# Patient Record
Sex: Female | Born: 1955 | Race: White | Hispanic: No | Marital: Married | Smoking: Never smoker
Health system: Southern US, Community
[De-identification: ages and names within clinical notes are randomized; demographics above are authoritative.]

## PROBLEM LIST (undated history)

## (undated) DIAGNOSIS — I341 Nonrheumatic mitral (valve) prolapse: Secondary | ICD-10-CM

## (undated) DIAGNOSIS — M810 Age-related osteoporosis without current pathological fracture: Secondary | ICD-10-CM

## (undated) DIAGNOSIS — I671 Cerebral aneurysm, nonruptured: Secondary | ICD-10-CM

## (undated) DIAGNOSIS — E7800 Pure hypercholesterolemia, unspecified: Secondary | ICD-10-CM

## (undated) DIAGNOSIS — Z8679 Personal history of other diseases of the circulatory system: Secondary | ICD-10-CM

## (undated) HISTORY — DX: Nonrheumatic mitral (valve) prolapse: I34.1

## (undated) HISTORY — DX: Age-related osteoporosis without current pathological fracture: M81.0

## (undated) HISTORY — DX: Pure hypercholesterolemia, unspecified: E78.00

## (undated) HISTORY — DX: Personal history of other diseases of the circulatory system: Z86.79

## (undated) HISTORY — DX: Cerebral aneurysm, nonruptured: I67.1

---

## 1974-10-26 HISTORY — PX: APPENDECTOMY: SHX54

## 1990-08-04 DIAGNOSIS — O321XX0 Maternal care for breech presentation, not applicable or unspecified: Secondary | ICD-10-CM

## 2008-07-04 ENCOUNTER — Encounter (INDEPENDENT_AMBULATORY_CARE_PROVIDER_SITE_OTHER): Payer: Self-pay | Admitting: Cardiovascular Disease

## 2008-07-04 ENCOUNTER — Ambulatory Visit: Admission: RE | Admit: 2008-07-04 | Discharge: 2008-07-04 | Payer: Self-pay | Admitting: Cardiovascular Disease

## 2014-12-25 DIAGNOSIS — M810 Age-related osteoporosis without current pathological fracture: Secondary | ICD-10-CM

## 2014-12-25 HISTORY — DX: Age-related osteoporosis without current pathological fracture: M81.0

## 2014-12-27 ENCOUNTER — Ambulatory Visit (INDEPENDENT_AMBULATORY_CARE_PROVIDER_SITE_OTHER): Payer: BLUE CROSS/BLUE SHIELD | Admitting: Nurse Practitioner

## 2014-12-27 ENCOUNTER — Encounter: Payer: Self-pay | Admitting: Nurse Practitioner

## 2014-12-27 ENCOUNTER — Other Ambulatory Visit: Payer: Self-pay | Admitting: Nurse Practitioner

## 2014-12-27 VITALS — BP 146/84 | HR 72 | Ht 66.25 in | Wt 151.0 lb

## 2014-12-27 DIAGNOSIS — Z0000 Encounter for general adult medical examination without abnormal findings: Secondary | ICD-10-CM

## 2014-12-27 DIAGNOSIS — Z01419 Encounter for gynecological examination (general) (routine) without abnormal findings: Secondary | ICD-10-CM

## 2014-12-27 DIAGNOSIS — N952 Postmenopausal atrophic vaginitis: Secondary | ICD-10-CM

## 2014-12-27 DIAGNOSIS — Z1231 Encounter for screening mammogram for malignant neoplasm of breast: Secondary | ICD-10-CM

## 2014-12-27 LAB — POCT URINALYSIS DIPSTICK
Bilirubin, UA: NEGATIVE
Blood, UA: NEGATIVE
Glucose, UA: NEGATIVE
Ketones, UA: NEGATIVE
LEUKOCYTES UA: NEGATIVE
Nitrite, UA: NEGATIVE
PH UA: 7
PROTEIN UA: NEGATIVE
UROBILINOGEN UA: NEGATIVE

## 2014-12-27 NOTE — Patient Instructions (Addendum)

## 2014-12-27 NOTE — Progress Notes (Signed)
Patient ID: Mary Rush, female   DOB: 1955-11-05, 59 y.o.   MRN: [id number] 59 y.o. G36P3003 Married  Caucasian Fe here for [place] annual exam.  Menopausal at age 5.  No HRT.  Did well with vaso symptoms treating with all natural supplements.  She has had increase in vaginal dryness and was given Vagifem in the past.  She did not want to use secondary to concerns about cancer.  She is willing to try Vagifem if the extra virgin olive oil does not work.  She was living in [place] and working in [place].  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  Patient's last menstrual period was 07/26/2005 (approximate).          Sexually active: Yes.    The current method of family planning is post menopausal status.    Exercising: Yes.    walking and low impact aerobics Smoker:  no  Health Maintenance: Pap:  07/2012, No pap history of abnormal MMG:  08/2012, normal Colonoscopy:  2008, normal, repeat in 10 years BMD:  2006, normal TDaP:  2005 Labs:  HB:  PCP, has appt in 2 weeks  Urine:  Negative    reports that she has never smoked. She has never used smokeless tobacco. She reports that she drinks about 1.2 oz of alcohol per week. She reports that she does not use illicit drugs.  History reviewed. No pertinent past medical history.  Past Surgical History  Procedure Laterality Date  . Appendectomy  1976  . Cesarean section      No current outpatient prescriptions on file.   No current facility-administered medications for this visit.    Family History  Problem Relation Age of Onset  . Hyperlipidemia Mother   . Heart failure Mother   . Breast cancer Mother 19    BRCA -, mastectomy right breast  . Alcohol abuse Father   . Hypertension Sister   . Hyperlipidemia Sister   . Hyperlipidemia Brother   . Hypertension Brother   . Hyperlipidemia Maternal Uncle   . Hypertension Maternal Uncle   . Heart disease Maternal Uncle   . Stroke Paternal Grandmother   . Stroke Paternal  Grandfather   . Hypertension Brother   . Hyperlipidemia Brother     ROS:  Pertinent items are noted in HPI.  Otherwise, a comprehensive ROS was negative.  Exam:   BP 146/84 mmHg  Pulse 72  Ht 5' 6.25" (1.683 m)  Wt 151 lb (68.493 kg)  BMI 24.18 kg/m2  LMP 07/26/2005 (Approximate) Height: 5' 6.25" (168.3 cm) Ht Readings from Last 3 Encounters:  12/27/14 5' 6.25" (1.683 m)    General appearance: alert, cooperative and appears stated age Head: Normocephalic, without obvious abnormality, atraumatic Neck: no adenopathy, supple, symmetrical, trachea midline and thyroid normal to inspection and palpation Lungs: clear to auscultation bilaterally Breasts: normal appearance, no masses or tenderness, inversion of left nipple whch has been normal for her. Heart: regular rate and rhythm Abdomen: soft, non-tender; no masses,  no organomegaly Extremities: extremities normal, atraumatic, no cyanosis or edema Skin: Skin color, texture, turgor normal. No rashes or lesions Lymph nodes: Cervical, supraclavicular, and axillary nodes normal. No abnormal inguinal nodes palpated Neurologic: Grossly normal   Pelvic: External genitalia:  no lesions              Urethra:  normal appearing urethra with no masses, tenderness or lesions  Bartholin's and Skene's: normal                 Vagina: atrophic appearing vagina with normal color and discharge, no lesions              Cervix: anteverted              Pap taken: Yes.   Bimanual Exam:  Uterus:  normal size, contour, position, consistency, mobility, non-tender              Adnexa: no mass, fullness, tenderness               Rectovaginal: Confirms               Anus:  normal sphincter tone, no lesions  Chaperone present:  yes  A:  Well Woman with normal exam  Postmenopausal no HRT  Atrophic vaginitis - past treatment with Vagifem    P:   Reviewed health and wellness pertinent to exam  Pap smear taken today  Mammogram is due and  will schedule for her  Discussion about WHI study and use of Vaginal estrogen.  Will follow with pap  She would like to try natural method for treatment with EVOO or coconut oil first.  Counseled on breast self exam, mammography screening, use and side effects of HRT, adequate intake of calcium and vitamin D, diet and exercise return annually or prn  An After Visit Summary was printed and given to the patient.  Medical records from [place] were to be sent over 2 weeks ago - she will contact them again.  Would like to review BMD and pap's

## 2014-12-27 NOTE — Progress Notes (Signed)
Scheduled patient while in office for 3D mammogram at [facility]. Appointment scheduled for 3/24 at 8:30am. Patient is agreeable to date and time.

## 2014-12-31 NOTE — Progress Notes (Signed)
Encounter reviewed by Dr. Amado Andal Silva.  

## 2015-01-01 LAB — IPS PAP TEST WITH HPV

## 2015-01-10 ENCOUNTER — Other Ambulatory Visit: Payer: Self-pay | Admitting: Family Medicine

## 2015-01-10 DIAGNOSIS — E2839 Other primary ovarian failure: Secondary | ICD-10-CM

## 2015-01-14 ENCOUNTER — Ambulatory Visit
Admission: RE | Admit: 2015-01-14 | Discharge: 2015-01-14 | Disposition: A | Discharge status: Home / Self Care | Payer: PRIVATE HEALTH INSURANCE | Source: Ambulatory Visit | Attending: Family Medicine | Admitting: Family Medicine

## 2015-01-14 ENCOUNTER — Ambulatory Visit
Admission: RE | Admit: 2015-01-14 | Discharge: 2015-01-14 | Disposition: A | Discharge status: Home / Self Care | Payer: PRIVATE HEALTH INSURANCE | Source: Ambulatory Visit | Attending: Nurse Practitioner | Admitting: Nurse Practitioner

## 2015-01-14 DIAGNOSIS — Z1231 Encounter for screening mammogram for malignant neoplasm of breast: Secondary | ICD-10-CM

## 2015-01-14 DIAGNOSIS — E2839 Other primary ovarian failure: Secondary | ICD-10-CM

## 2015-01-17 ENCOUNTER — Ambulatory Visit: Payer: Self-pay

## 2015-02-11 ENCOUNTER — Encounter: Payer: Self-pay | Admitting: Certified Nurse Midwife

## 2015-12-30 ENCOUNTER — Encounter: Payer: Self-pay | Admitting: Nurse Practitioner

## 2015-12-30 ENCOUNTER — Ambulatory Visit (INDEPENDENT_AMBULATORY_CARE_PROVIDER_SITE_OTHER): Payer: BLUE CROSS/BLUE SHIELD | Admitting: Nurse Practitioner

## 2015-12-30 VITALS — BP 132/90 | HR 64 | Ht 66.0 in | Wt 151.0 lb

## 2015-12-30 DIAGNOSIS — N952 Postmenopausal atrophic vaginitis: Secondary | ICD-10-CM | POA: Diagnosis not present

## 2015-12-30 DIAGNOSIS — M810 Age-related osteoporosis without current pathological fracture: Secondary | ICD-10-CM | POA: Diagnosis not present

## 2015-12-30 DIAGNOSIS — Z0000 Encounter for general adult medical examination without abnormal findings: Secondary | ICD-10-CM

## 2015-12-30 DIAGNOSIS — E7800 Pure hypercholesterolemia, unspecified: Secondary | ICD-10-CM | POA: Diagnosis not present

## 2015-12-30 DIAGNOSIS — Z01419 Encounter for gynecological examination (general) (routine) without abnormal findings: Secondary | ICD-10-CM | POA: Diagnosis not present

## 2015-12-30 NOTE — Patient Instructions (Addendum)

## 2015-12-30 NOTE — Progress Notes (Signed)
Patient ID: Mary Rush, female   DOB: 08-Aug-1956, 60 y.o.   MRN: [id number] 60 y.o. G51P3003 Married  Caucasian Fe here for annual exam.  Monitors BP at home 120/80 with exercise and with no exercise 132/84.   Patient's last menstrual period was 07/26/2005 (approximate).          Sexually active: Yes.    The current method of family planning is post menopausal status.    Exercising: Yes.    Home exercise routine includes walking and light weights at least 3 times per week up to 6 times per week. Smoker:  no  Health Maintenance: Pap:12/27/14, Negative with neg HR HPV MMG:01/14/15, Bi-Rads 1: Negative  Colonoscopy: 2008, normal, repeat in 10 years BMD:01/14/15, T Score:  -2.7 Spine / -2.9 Left Femur Neck TDaP: 2005 PCP Shingles: PCP Pneumonia: Not indicated due to age Hep C and HIV: PCP Labs: PCP   reports that she has never smoked. She has never used smokeless tobacco. She reports that she drinks about 1.2 oz of alcohol per week. She reports that she does not use illicit drugs.  History reviewed. No pertinent past medical history.  Past Surgical History  Procedure Laterality Date  . Appendectomy  1976  . Cesarean section      Current Outpatient Prescriptions  Medication Sig Dispense Refill  . aspirin EC 81 MG tablet Take 81 mg by mouth 2 (two) times a week.    . Calcium Carbonate-Vit D-Min (CALCIUM 1200 PO) Take 1,200 mg by mouth daily.    . Flaxseed, Linseed, (FLAX SEED OIL) 1000 MG CAPS Take 1,000 mg by mouth 2 (two) times a week.     No current facility-administered medications for this visit.    Family History  Problem Relation Age of Onset  . Hyperlipidemia Mother   . Heart failure Mother   . Breast cancer Mother 51    BRCA -, mastectomy right breast  . Alcohol abuse Father   . Hypertension Sister   . Hyperlipidemia Sister   . Hyperlipidemia Brother   . Hypertension Brother   . Hyperlipidemia Maternal Uncle   . Hypertension Maternal Uncle   . Heart disease  Maternal Uncle   . Stroke Paternal Grandmother   . Stroke Paternal Grandfather   . Hypertension Brother   . Hyperlipidemia Brother     ROS:  Pertinent items are noted in HPI.  Otherwise, a comprehensive ROS was negative.  Exam:   BP 132/90 mmHg  Pulse 64  Ht 5' 6" (1.676 m)  Wt 151 lb (68.493 kg)  BMI 24.38 kg/m2  LMP 07/26/2005 (Approximate) Height: 5' 6" (167.6 cm) Ht Readings from Last 3 Encounters:  12/30/15 5' 6" (1.676 m)  12/27/14 5' 6.25" (1.683 m)    General appearance: alert, cooperative and appears stated age Head: Normocephalic, without obvious abnormality, atraumatic Neck: no adenopathy, supple, symmetrical, trachea midline and thyroid normal to inspection and palpation Lungs: clear to auscultation bilaterally Breasts: normal appearance, no masses or tenderness Heart: regular rate and rhythm Abdomen: soft, non-tender; no masses,  no organomegaly Extremities: extremities normal, atraumatic, no cyanosis or edema Skin: Skin color, texture, turgor normal. No rashes or lesions Lymph nodes: Cervical, supraclavicular, and axillary nodes normal. No abnormal inguinal nodes palpated Neurologic: Grossly normal   Pelvic: External genitalia:  no lesions              Urethra:  normal appearing urethra with no masses, tenderness or lesions  Bartholin's and Skene's: normal                 Vagina: normal appearing vagina with normal color and discharge, no lesions              Cervix: anteverted              Pap taken: No. Bimanual Exam:  Uterus:  normal size, contour, position, consistency, mobility, non-tender              Adnexa: no mass, fullness, tenderness               Rectovaginal: Confirms               Anus:  normal sphincter tone, no lesions  Chaperone present: no  A:  Well Woman with normal exam  Postmenopausal no HRT Atrophic vaginitis - past treatment with Vagifem  Osteoporosis  Hypercholesterolemia  White coat  hypertension  History of MVP   P:   Reviewed health and wellness pertinent to exam  Pap smear as above  Mammogram is due 12/2015  She is changing to a new PCP - we will make a referral; and will discuss osteoporosis  She plans on update of immunizations at PCP  Counseled on breast self exam, mammography screening, adequate intake of calcium and vitamin D, diet and exercise return annually or prn  An After Visit Summary was printed and given to the patient.

## 2016-01-03 NOTE — Progress Notes (Signed)
Encounter reviewed by Dr. Brook Amundson C. Silva.  

## 2016-01-14 ENCOUNTER — Other Ambulatory Visit: Payer: Self-pay

## 2016-01-14 DIAGNOSIS — Z1231 Encounter for screening mammogram for malignant neoplasm of breast: Secondary | ICD-10-CM

## 2016-01-28 ENCOUNTER — Ambulatory Visit
Admission: RE | Admit: 2016-01-28 | Discharge: 2016-01-28 | Disposition: A | Discharge status: Home / Self Care | Payer: BLUE CROSS/BLUE SHIELD | Source: Ambulatory Visit

## 2016-01-28 DIAGNOSIS — Z1231 Encounter for screening mammogram for malignant neoplasm of breast: Secondary | ICD-10-CM

## 2016-06-30 ENCOUNTER — Other Ambulatory Visit: Payer: Self-pay | Admitting: Internal Medicine

## 2016-06-30 DIAGNOSIS — R0609 Other forms of dyspnea: Principal | ICD-10-CM

## 2016-07-10 ENCOUNTER — Other Ambulatory Visit (HOSPITAL_COMMUNITY): Payer: BLUE CROSS/BLUE SHIELD

## 2016-08-03 ENCOUNTER — Other Ambulatory Visit: Payer: Self-pay | Admitting: Cardiology

## 2016-08-03 DIAGNOSIS — Z823 Family history of stroke: Secondary | ICD-10-CM

## 2016-08-15 ENCOUNTER — Ambulatory Visit
Admission: RE | Admit: 2016-08-15 | Discharge: 2016-08-15 | Disposition: A | Discharge status: Home / Self Care | Payer: BLUE CROSS/BLUE SHIELD | Source: Ambulatory Visit | Attending: Cardiology | Admitting: Cardiology

## 2016-08-15 DIAGNOSIS — Z823 Family history of stroke: Secondary | ICD-10-CM

## 2016-08-20 ENCOUNTER — Emergency Department (HOSPITAL_COMMUNITY)
Admission: EM | Admit: 2016-08-20 | Discharge: 2016-08-20 | Disposition: A | Discharge status: Home / Self Care | Payer: BLUE CROSS/BLUE SHIELD | Attending: Emergency Medicine | Admitting: Emergency Medicine

## 2016-08-20 ENCOUNTER — Emergency Department (HOSPITAL_COMMUNITY): Payer: BLUE CROSS/BLUE SHIELD

## 2016-08-20 ENCOUNTER — Encounter (HOSPITAL_COMMUNITY): Payer: Self-pay | Admitting: Emergency Medicine

## 2016-08-20 DIAGNOSIS — R55 Syncope and collapse: Secondary | ICD-10-CM | POA: Diagnosis not present

## 2016-08-20 DIAGNOSIS — Y92009 Unspecified place in unspecified non-institutional (private) residence as the place of occurrence of the external cause: Secondary | ICD-10-CM

## 2016-08-20 DIAGNOSIS — W19XXXA Unspecified fall, initial encounter: Secondary | ICD-10-CM

## 2016-08-20 DIAGNOSIS — Y999 Unspecified external cause status: Secondary | ICD-10-CM | POA: Insufficient documentation

## 2016-08-20 DIAGNOSIS — Y9289 Other specified places as the place of occurrence of the external cause: Secondary | ICD-10-CM | POA: Diagnosis not present

## 2016-08-20 DIAGNOSIS — Z7982 Long term (current) use of aspirin: Secondary | ICD-10-CM | POA: Diagnosis not present

## 2016-08-20 DIAGNOSIS — Y9301 Activity, walking, marching and hiking: Secondary | ICD-10-CM | POA: Diagnosis not present

## 2016-08-20 DIAGNOSIS — W0110XA Fall on same level from slipping, tripping and stumbling with subsequent striking against unspecified object, initial encounter: Secondary | ICD-10-CM | POA: Insufficient documentation

## 2016-08-20 DIAGNOSIS — M79651 Pain in right thigh: Secondary | ICD-10-CM | POA: Diagnosis present

## 2016-08-20 LAB — CBC
HCT: 38.7 % (ref 36.0–46.0)
Hemoglobin: 13.2 g/dL (ref 12.0–15.0)
MCH: 31.8 pg (ref 26.0–34.0)
MCHC: 34.1 g/dL (ref 30.0–36.0)
MCV: 93.3 fL (ref 78.0–100.0)
PLATELETS: 252 10*3/uL (ref 150–400)
RBC: 4.15 MIL/uL (ref 3.87–5.11)
RDW: 12.8 % (ref 11.5–15.5)
WBC: 11.3 10*3/uL — AB (ref 4.0–10.5)

## 2016-08-20 LAB — BASIC METABOLIC PANEL
Anion gap: 5 (ref 5–15)
BUN: 11 mg/dL (ref 6–20)
CALCIUM: 9.1 mg/dL (ref 8.9–10.3)
CO2: 24 mmol/L (ref 22–32)
CREATININE: 0.85 mg/dL (ref 0.44–1.00)
Chloride: 105 mmol/L (ref 101–111)
Glucose, Bld: 113 mg/dL — ABNORMAL HIGH (ref 65–99)
Potassium: 4.5 mmol/L (ref 3.5–5.1)
SODIUM: 134 mmol/L — AB (ref 135–145)

## 2016-08-20 LAB — CBG MONITORING, ED: Glucose-Capillary: 101 mg/dL — ABNORMAL HIGH (ref 65–99)

## 2016-08-20 NOTE — ED Triage Notes (Signed)
Mechanical fall; tripped over a rug corner; fell against a toy car hitting right thigh; pain in right thigh was so intense it made her pass out; when came to after a few minutes felt diaphoretic and nauseated. Did not hit head. Pain in leg dissipated, still feels nauseated.

## 2016-08-20 NOTE — ED Provider Notes (Signed)
[facility] DEPT Provider Note   CSN: [id number] Arrival date & time: 08/20/16  1122     History   Chief Complaint Chief Complaint  Patient presents with  . Fall  . Loss of Consciousness    HPI Mary Rush is a 60 y.o. female.  HPI   60 year old female with history of osteoporosis and history of mitral valve prolapse presenting for evaluation of a recent fall. Patient report 2 hours ago she was babysitting her grandchild. While holding her grandchild and walking she accidentally tripped over the carpet and fell to her right side. Patient states she was trying to protect her grandchild from striking the floor and therefore her entire thigh took the brunt of the impact that she hits a toy car on the floor. She report acute onset of sharp throbbing pain to her right thigh follows with feeling lightheadedness from the intense pain. Patient states she sat up on the couch, with her head between her knee and subsequently had a brief non-witnessed syncopal episode. Patient came to, she was able to ambulate and decided to seek medical attention for her thigh pain. Patient states she has history of osteoporosis and concern for potential broken bone or vascular injury. She did not experience any pops or cracks from the fall. At this time her pain has subsided and patient felt much better. She denies having any active headache, neck pain, chest pain, trouble breathing, abdominal pain, back pain, hip, knee, or ankle pain. Her primary  pain is to the right lateral thigh. No specific treatment tried. She denies any precipitating symptoms prior to the fall. Currently denies having any numbness.  Past Medical History:  Diagnosis Date  . MVP (mitral valve prolapse)    past cardio eval yearly with echo.  . Osteoporosis 12/2014    There are no active problems to display for this patient.   Past Surgical History:  Procedure Laterality Date  . APPENDECTOMY  1976  . CESAREAN SECTION      OB  History    Gravida Para Term Preterm AB Living   _0 0 0 3   SAB TAB Ectopic Multiple Live Births   0 0 0 0 3       Home Medications    Prior to Admission medications   Medication Sig Start Date End Date Taking? Authorizing Provider  aspirin EC 81 MG tablet Take 81 mg by mouth 2 (two) times a week.    Historical Provider, MD  Calcium Carbonate-Vit D-Min (CALCIUM 1200 PO) Take 1,200 mg by mouth daily.    Historical Provider, MD  Flaxseed, Linseed, (FLAX SEED OIL) 1000 MG CAPS Take 1,000 mg by mouth 2 (two) times a week.    Historical Provider, MD    Family History Family History  Problem Relation Age of Onset  . Hyperlipidemia Mother   . Heart failure Mother   . Breast cancer Mother 61    BRCA -, mastectomy right breast  . Alcohol abuse Father   . Hypertension Sister   . Hyperlipidemia Sister   . Hyperlipidemia Brother   . Hypertension Brother   . Hyperlipidemia Maternal Uncle   . Hypertension Maternal Uncle   . Heart disease Maternal Uncle   . Stroke Paternal Grandmother   . Stroke Paternal Grandfather   . Hypertension Brother   . Hyperlipidemia Brother     Social History Social History  Substance Use Topics  . Smoking status: Never Smoker  . Smokeless tobacco: Never Used  .  Alcohol use 1.2 oz/week    2 Standard drinks or equivalent per week     Allergies   Review of patient's allergies indicates no known allergies.   Review of Systems Review of Systems  All other systems reviewed and are negative.    Physical Exam Updated Vital Signs BP 140/90 (BP Location: Right Arm)   Pulse (!) 57   Temp 98 F (36.7 C) (Oral)   Resp 18   Ht _0  (1.702 m)   Wt 68 kg   LMP 07/26/2005 (Approximate)   SpO2 99%   BMI 23.49 kg/m   Physical Exam  Constitutional: She is oriented to person, place, and time. She appears well-developed and well-nourished. No distress.  HENT:  Head: Atraumatic.  Eyes: Conjunctivae are normal.  Neck: Neck supple.    Cardiovascular: Normal rate and regular rhythm.   Pulmonary/Chest: Effort normal and breath sounds normal.  Abdominal: Soft. There is no tenderness.  Musculoskeletal: She exhibits tenderness (Right thigh: Point tenderness to lateral distal thigh with a faint erythema without any crepitus or deformity. No ecchymosis noted.).  Right hip and pelvis as well as right knee and ankle are nontender with full range of motion.  Neurological: She is alert and oriented to person, place, and time. She has normal strength. No cranial nerve deficit or sensory deficit. GCS eye subscore is 4. GCS verbal subscore is 5. GCS motor subscore is 6.  Skin: No rash noted.  Psychiatric: She has a normal mood and affect.  Nursing note and vitals reviewed.    ED Treatments / Results  Labs (all labs ordered are listed, but only abnormal results are displayed) Labs Reviewed  BASIC METABOLIC PANEL - Abnormal; Notable for the following:       Result Value   Sodium 134 (*)    Glucose, Bld 113 (*)    All other components within normal limits  CBC - Abnormal; Notable for the following:    WBC 11.3 (*)    All other components within normal limits  CBG MONITORING, ED - Abnormal; Notable for the following:    Glucose-Capillary 101 (*)    All other components within normal limits  URINALYSIS, ROUTINE W REFLEX MICROSCOPIC (NOT AT [facility])    EKG  EKG Interpretation None       Radiology Dg Femur Min 2 Views Right  Result Date: 08/20/2016 CLINICAL DATA:  Injury.  Fall. EXAM: RIGHT FEMUR 2 VIEWS COMPARISON:  No recent prior. FINDINGS: Degenerative changes right hip. No acute bony or joint abnormality. No evidence of fracture dislocation. Tiny sclerotic focus noted over the right acetabulum most likely tiny bone island . IMPRESSION: Degenerative changes right hip.  No acute bony or joint abnormality. Electronically Signed   By: Marcello Moores  Register   On: 08/20/2016 13:33    Procedures Procedures (including critical care  time)  Medications Ordered in ED Medications - No data to display   Initial Impression / Assessment and Plan / ED Course  I have reviewed the triage vital signs and the nursing notes.  Pertinent labs & imaging results that were available during my care of the patient were reviewed by me and considered in my medical decision making (see chart for details).  Clinical Course    BP 131/94   Pulse 70   Temp 98 F (36.7 C) (Oral)   Resp 12   Ht _1  (1.702 m)   Wt 68 kg   LMP 07/26/2005 (Approximate)   SpO2 100%   BMI  23.49 kg/m    Final Clinical Impressions(s) / ED Diagnoses   Final diagnoses:  Fall in home, initial encounter    New Prescriptions New Prescriptions   No medications on file   1:12 PM Patient had a mechanical fall injuring her right thigh. She was able to ambulate afterward, I have low suspicion for any acute fractures or dislocation. Her syncopal episode is likely secondary to pain from the fall. No other precipitating symptoms. She is at baseline and has no focal neuro deficit on exam.  4:18 PM Xray of R femur without acute finding.  Pt ambulate without difficulty.  Normal orthostatic vital sign.  Pt felt comfortable going home.  Return precaution discussed.   Domenic Moras, PA-C 08/20/16 1618    Gareth Morgan, MD 08/20/16 2342

## 2016-12-01 DIAGNOSIS — I773 Arterial fibromuscular dysplasia: Secondary | ICD-10-CM | POA: Insufficient documentation

## 2016-12-01 DIAGNOSIS — I671 Cerebral aneurysm, nonruptured: Secondary | ICD-10-CM | POA: Insufficient documentation

## 2016-12-29 NOTE — Progress Notes (Signed)
Patient ID: Mary Rush, female   DOB: April 26, 1956, 61 y.o.   MRN: [id number]  61 y.o. G3P3003 Married  Caucasian Fe here for annual exam.  Evaluated at [facility] 11/05/2016 for a cerebral hemorrhage.  She had CT angiogram done and reveals no hemorrhage but vascular changes that could be FMD  (fibromuscular dysplasia).   Not on any BP med's.  No vaso symptoms.   Patient's last menstrual period was 07/26/2005 (approximate).          Sexually active: Yes.    The current method of family planning is post menopausal status.    Exercising: Yes.    Home exercise routine includes walking 30 minutes, weights and elliptical at least 15 minutes 5 days per week. Smoker:  no  Health Maintenance: Pap: 12/27/14, Negative with neg HR HPV  07/2012, Negative MMG:01/28/16, Bi-Rads 1: Negative  Colonoscopy: 2008, normal, repeat in 10 years BMD:01/14/15, T Score:  -2.7 Spine / -2.9 Left Femur Neck TDaP:2017 PCP Shingles: declines  Pneumonia: Not indicated due to age Hep C and HIV: PCP Labs: PCP   reports that she has never smoked. She has never used smokeless tobacco. She reports that she drinks about 1.2 oz of alcohol per week . She reports that she does not use drugs.  Past Medical History:  Diagnosis Date  . MVP (mitral valve prolapse)    past cardio eval yearly with echo.  . Osteoporosis 12/2014    Past Surgical History:  Procedure Laterality Date  . APPENDECTOMY  1976  . CESAREAN SECTION      Current Outpatient Prescriptions  Medication Sig Dispense Refill  . alendronate (FOSAMAX) 70 MG tablet Take 70 mg by mouth once a week. Take with a full glass of water on an empty stomach.    Marland Kitchen aspirin EC 81 MG tablet Take 81 mg by mouth 2 (two) times a week.    . Calcium Carbonate-Vit D-Min (CALCIUM 1200 PO) Take 1,200 mg by mouth daily.    . cholecalciferol (VITAMIN D) 1000 units tablet Take 1 tablet by mouth 3 (three) times a week.     No current facility-administered medications for this visit.      Family History  Problem Relation Age of Onset  . Hyperlipidemia Mother   . Heart failure Mother   . Breast cancer Mother 54    BRCA -, mastectomy right breast  . Alcohol abuse Father   . Hypertension Sister   . Hyperlipidemia Sister   . Hyperlipidemia Brother   . Hypertension Brother   . Hyperlipidemia Maternal Uncle   . Hypertension Maternal Uncle   . Heart disease Maternal Uncle   . Stroke Paternal Grandmother   . Stroke Paternal Grandfather   . Hypertension Brother   . Hyperlipidemia Brother     ROS:  Pertinent items are noted in HPI.  Otherwise, a comprehensive ROS was negative.  Exam:   BP 128/84 (BP Location: Right Arm, Patient Position: Sitting, Cuff Size: Normal)   Pulse 64   Ht 5' 6" (1.676 m)   Wt 153 lb (69.4 kg)   LMP 07/26/2005 (Approximate)   BMI 24.69 kg/m  Height: 5' 6" (167.6 cm) Ht Readings from Last 3 Encounters:  12/30/16 5' 6" (1.676 m)  08/20/16 5' 7" (1.702 m)  12/30/15 5' 6" (1.676 m)    General appearance: alert, cooperative and appears stated age Head: Normocephalic, without obvious abnormality, atraumatic Neck: no adenopathy, supple, symmetrical, trachea midline and thyroid normal to inspection and palpation Lungs: clear to  auscultation bilaterally Breasts: normal appearance, no masses or tenderness Heart: regular rate and rhythm Abdomen: soft, non-tender; no masses,  no organomegaly Extremities: extremities normal, atraumatic, no cyanosis or edema Skin: Skin color, texture, turgor normal. No rashes or lesions Lymph nodes: Cervical, supraclavicular, and axillary nodes normal. No abnormal inguinal nodes palpated Neurologic: Grossly normal   Pelvic: External genitalia:  no lesions              Urethra:  normal appearing urethra with no masses, tenderness or lesions              Bartholin's and Skene's: normal                 Vagina: normal appearing vagina with normal color and discharge, no lesions              Cervix: anteverted               Pap taken: No. Bimanual Exam:  Uterus:  normal size, contour, position, consistency, mobility, non-tender              Adnexa: no mass, fullness, tenderness               Rectovaginal: Confirms               Anus:  normal sphincter tone, no lesions  Chaperone present: yes  A:  Well Woman with normal exam  Postmenopausal no HRT Atrophic vaginitis - past treatment with Vagifem - do not use now!             Osteoporosis - managed by PCP             Hypercholesterolemia             White coat hypertension             History of MVP  New diagnosis of Fibromuscular dysplasia of the brain   P:   Reviewed health and wellness pertinent to exam  Pap smear not done  Mammogram is due 01/2017  Counseled on breast self exam, mammography screening, adequate intake of calcium and vitamin D, diet and exercise, Kegel's exercises return annually or prn  An After Visit Summary was printed and given to the patient.

## 2016-12-30 ENCOUNTER — Ambulatory Visit: Payer: 59 | Admitting: Nurse Practitioner

## 2016-12-30 ENCOUNTER — Encounter: Payer: Self-pay | Admitting: Nurse Practitioner

## 2016-12-30 VITALS — BP 128/84 | HR 64 | Ht 66.0 in | Wt 153.0 lb

## 2016-12-30 DIAGNOSIS — Z0000 Encounter for general adult medical examination without abnormal findings: Secondary | ICD-10-CM

## 2016-12-30 DIAGNOSIS — N952 Postmenopausal atrophic vaginitis: Secondary | ICD-10-CM

## 2016-12-30 DIAGNOSIS — Z01411 Encounter for gynecological examination (general) (routine) with abnormal findings: Secondary | ICD-10-CM | POA: Diagnosis not present

## 2016-12-30 DIAGNOSIS — Z8679 Personal history of other diseases of the circulatory system: Secondary | ICD-10-CM

## 2016-12-30 NOTE — Patient Instructions (Signed)

## 2017-01-04 NOTE — Progress Notes (Signed)
Encounter reviewed by Dr. Brook Amundson C. Silva.  

## 2017-02-01 ENCOUNTER — Other Ambulatory Visit: Payer: Self-pay | Admitting: Nurse Practitioner

## 2017-02-01 DIAGNOSIS — Z1231 Encounter for screening mammogram for malignant neoplasm of breast: Secondary | ICD-10-CM

## 2017-02-03 ENCOUNTER — Other Ambulatory Visit: Payer: Self-pay | Admitting: Internal Medicine

## 2017-02-03 DIAGNOSIS — M810 Age-related osteoporosis without current pathological fracture: Secondary | ICD-10-CM

## 2017-02-09 DIAGNOSIS — I10 Essential (primary) hypertension: Secondary | ICD-10-CM | POA: Insufficient documentation

## 2017-02-09 DIAGNOSIS — M810 Age-related osteoporosis without current pathological fracture: Secondary | ICD-10-CM | POA: Insufficient documentation

## 2017-02-16 ENCOUNTER — Ambulatory Visit: Payer: BLUE CROSS/BLUE SHIELD

## 2017-02-26 ENCOUNTER — Ambulatory Visit
Admission: RE | Admit: 2017-02-26 | Discharge: 2017-02-26 | Disposition: A | Discharge status: Home / Self Care | Payer: PRIVATE HEALTH INSURANCE | Source: Ambulatory Visit | Attending: Internal Medicine | Admitting: Internal Medicine

## 2017-02-26 ENCOUNTER — Ambulatory Visit
Admission: RE | Admit: 2017-02-26 | Discharge: 2017-02-26 | Disposition: A | Discharge status: Home / Self Care | Payer: PRIVATE HEALTH INSURANCE | Source: Ambulatory Visit | Attending: Nurse Practitioner | Admitting: Nurse Practitioner

## 2017-02-26 DIAGNOSIS — M810 Age-related osteoporosis without current pathological fracture: Secondary | ICD-10-CM

## 2017-02-26 DIAGNOSIS — Z1231 Encounter for screening mammogram for malignant neoplasm of breast: Secondary | ICD-10-CM

## 2017-05-24 ENCOUNTER — Telehealth: Payer: Self-pay | Admitting: Obstetrics & Gynecology

## 2017-05-24 NOTE — Telephone Encounter (Signed)
Left patient a message to call back to reschedule a future appointment that was cancelled by the provider. °

## 2018-01-03 ENCOUNTER — Ambulatory Visit: Payer: 59 | Admitting: Nurse Practitioner

## 2018-01-05 ENCOUNTER — Encounter: Payer: Self-pay | Admitting: Certified Nurse Midwife

## 2018-01-05 ENCOUNTER — Other Ambulatory Visit (HOSPITAL_COMMUNITY)
Admission: RE | Admit: 2018-01-05 | Discharge: 2018-01-05 | Disposition: A | Discharge status: Home / Self Care | Payer: PRIVATE HEALTH INSURANCE | Source: Ambulatory Visit | Attending: Certified Nurse Midwife | Admitting: Certified Nurse Midwife

## 2018-01-05 ENCOUNTER — Other Ambulatory Visit: Payer: Self-pay

## 2018-01-05 ENCOUNTER — Ambulatory Visit (INDEPENDENT_AMBULATORY_CARE_PROVIDER_SITE_OTHER): Payer: No Typology Code available for payment source | Admitting: Certified Nurse Midwife

## 2018-01-05 VITALS — BP 110/68 | HR 68 | Resp 16 | Ht 66.25 in | Wt 159.0 lb

## 2018-01-05 DIAGNOSIS — Z780 Asymptomatic menopausal state: Secondary | ICD-10-CM | POA: Diagnosis not present

## 2018-01-05 DIAGNOSIS — Z01419 Encounter for gynecological examination (general) (routine) without abnormal findings: Secondary | ICD-10-CM

## 2018-01-05 DIAGNOSIS — Z124 Encounter for screening for malignant neoplasm of cervix: Secondary | ICD-10-CM

## 2018-01-05 NOTE — Progress Notes (Signed)
62 y.o. G88P3003 Married  Caucasian Fe here for annual exam. Menopausal  No HRT. Denies vaginal bleeding. Some vaginal dryness and using coconut oil prn. Sees Dr. Virgina Jock yearly for cholesterol/Fosamax management and labs. 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 Staying busy since retirement. No other health concerns today. Considering Cologard instead of colonoscopy at this point. Planning vacation at some point!   Patient's last menstrual period was 07/26/2005 (approximate).          Sexually active: No.  The current method of family planning is post menopausal status.    Exercising: Yes.    walking Smoker:  no  Health Maintenance: Pap:  12-27-14 neg HPV HR neg History of Abnormal Pap: no MMG:  02-26-17 category c density birads 1:neg Self Breast exams: yes Colonoscopy:  2008 f/u 38yr BMD:   2018 TDaP:  2017 Shingles: no, declines Pneumonia: no, declines Hep C and HIV: not done Labs: pcp   reports that  has never smoked. she has never used smokeless tobacco. She reports that she drinks about 0.6 - 1.2 oz of alcohol per week. She reports that she does not use drugs.  Past Medical History:  Diagnosis Date  . MVP (mitral valve prolapse)    per patient physician states she does not have this  . Osteoporosis 12/2014    Past Surgical History:  Procedure Laterality Date  . APPENDECTOMY  1976  . CESAREAN SECTION      Current Outpatient Medications  Medication Sig Dispense Refill  . alendronate (FOSAMAX) 70 MG tablet Take 70 mg by mouth once a week. Take with a full glass of water on an empty stomach.    . AMLODIPINE BESYLATE PO 5 mg daily.    .Marland Kitchenaspirin EC 81 MG tablet Take 81 mg by mouth 2 (two) times a week.    .Marland KitchenCALCIUM PO Take by mouth.    . rosuvastatin (CRESTOR) 5 MG tablet Take 5 mg by mouth daily.     No current facility-administered medications for this visit.     Family History  Problem Relation Age of  Onset  . Hyperlipidemia Mother   . Heart failure Mother   . Breast cancer Mother 815      BRCA -, mastectomy right breast  . Alcohol abuse Father   . Hypertension Sister   . Hyperlipidemia Sister   . Hyperlipidemia Brother   . Hypertension Brother   . Hyperlipidemia Maternal Uncle   . Hypertension Maternal Uncle   . Heart disease Maternal Uncle   . Stroke Paternal Grandmother   . Stroke Paternal Grandfather   . Hypertension Brother   . Hyperlipidemia Brother     ROS:  Pertinent items are noted in HPI.  Otherwise, a comprehensive ROS was negative.  Exam:   BP 110/68   Pulse 68   Resp 16   Ht 5' 6.25" (1.683 m)   Wt 159 lb (72.1 kg)   LMP 07/26/2005 (Approximate)   BMI 25.47 kg/m  Height: 5' 6.25" (168.3 cm) Ht Readings from Last 3 Encounters:  01/05/18 5' 6.25" (1.683 m)  12/30/16 '5\' 6"'  (1.676 m)  08/20/16 '5\' 7"'  (1.702 m)    General appearance: alert, cooperative and appears stated age Head: Normocephalic, without obvious abnormality, atraumatic Neck: no adenopathy, supple, symmetrical, trachea midline and thyroid normal to inspection and palpation Lungs: clear to auscultation bilaterally Breasts: normal appearance, no masses or tenderness, No nipple retraction or dimpling,  No nipple discharge or bleeding, No axillary or supraclavicular adenopathy, inverted nipples easily seen Heart: regular rate and rhythm Abdomen: soft, non-tender; no masses,  no organomegaly Extremities: extremities normal, atraumatic, no cyanosis or edema Skin: Skin color, texture, turgor normal. No rashes or lesions Lymph nodes: Cervical, supraclavicular, and axillary nodes normal. No abnormal inguinal nodes palpated Neurologic: Grossly normal   Pelvic: External genitalia:  no lesions              Urethra:  normal appearing urethra with no masses, tenderness or lesions              Bartholin's and Skene's: normal                 Vagina: normal appearing vagina with normal color and discharge,  no lesions              Cervix: no cervical motion tenderness, no lesions and nulliparous appearance              Pap taken: Yes.   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 Patient prefers to continue coconut oil use instructions given. Questions addressed.  Continue follow with MD's as indicated.  Will do Cologard with Dr. Virgina Jock.  Pap smear: yes   counseled on breast self exam, mammography screening, menopause, adequate intake of calcium and vitamin D, diet and exercise, Kegel's exercises  return annually or prn  An After Visit Summary was printed and given to the patient.

## 2018-01-05 NOTE — Patient Instructions (Signed)

## 2018-01-06 LAB — CYTOLOGY - PAP
DIAGNOSIS: NEGATIVE
HPV: NOT DETECTED

## 2018-03-25 ENCOUNTER — Other Ambulatory Visit: Payer: Self-pay | Admitting: Internal Medicine

## 2018-03-25 DIAGNOSIS — Z1231 Encounter for screening mammogram for malignant neoplasm of breast: Secondary | ICD-10-CM

## 2018-03-25 DIAGNOSIS — E785 Hyperlipidemia, unspecified: Secondary | ICD-10-CM | POA: Insufficient documentation

## 2018-03-30 ENCOUNTER — Ambulatory Visit (INDEPENDENT_AMBULATORY_CARE_PROVIDER_SITE_OTHER): Payer: PRIVATE HEALTH INSURANCE

## 2018-03-30 DIAGNOSIS — Z1231 Encounter for screening mammogram for malignant neoplasm of breast: Secondary | ICD-10-CM

## 2019-01-25 ENCOUNTER — Ambulatory Visit: Payer: No Typology Code available for payment source | Admitting: Certified Nurse Midwife

## 2019-04-17 ENCOUNTER — Ambulatory Visit: Payer: No Typology Code available for payment source | Admitting: Certified Nurse Midwife

## 2019-05-01 NOTE — Progress Notes (Signed)
63 y.o. G28P3003 Married  Caucasian Fe here for annual exam. Post menopausal denies vaginal bleeding or some dryness, not sexually active due spouse health. Sees PCP Dr. Virgina Jock for cholesterol and osteopenia management. Walking for exercise and weight loss down 11 pounds. Has retired and trying to find things to do! Family marriage recently. No other health issues today.  Patient's last menstrual period was 07/26/2005 (approximate).          Sexually active: No.  The current method of family planning is post menopausal status.    Exercising: Yes.    walking Smoker:  no  Review of Systems  Constitutional: Negative.   HENT: Negative.   Eyes: Negative.   Respiratory: Negative.   Cardiovascular: Negative.   Gastrointestinal: Negative.   Genitourinary: Negative.   Musculoskeletal: Negative.   Skin:       Vaginal atrophy  Neurological: Negative.   Endo/Heme/Allergies: Negative.   Psychiatric/Behavioral: Negative.     Health Maintenance: Pap:  01-05-18 neg HPV HR neg presence of endos cant be determined due to atrophy. History of Abnormal Pap: no MMG:  03-30-18 category c density birads 1:neg Self Breast exams: yes Colonoscopy:  2008, cologard 52yr ago BMD:   2018 TDaP:  2017  Shingles: declines Pneumonia: declines Hep C and HIV: not done Labs: PCP   reports that she has never smoked. She has never used smokeless tobacco. She reports current alcohol use of about 1.0 - 2.0 standard drinks of alcohol per week. She reports that she does not use drugs.  Past Medical History:  Diagnosis Date  . MVP (mitral valve prolapse)    per patient physician states she does not have this  . Osteoporosis 12/2014    Past Surgical History:  Procedure Laterality Date  . APPENDECTOMY  1976  . CESAREAN SECTION      Current Outpatient Medications  Medication Sig Dispense Refill  . alendronate (FOSAMAX) 70 MG tablet Take 70 mg by mouth once a week. Take with a full glass of water on an empty stomach.     . AMLODIPINE BESYLATE PO 5 mg daily.    .Marland Kitchenaspirin EC 81 MG tablet Take 81 mg by mouth 2 (two) times a week.    .Marland KitchenCALCIUM PO Take by mouth.    . rosuvastatin (CRESTOR) 5 MG tablet Take 5 mg by mouth daily.     No current facility-administered medications for this visit.     Family History  Problem Relation Age of Onset  . Hyperlipidemia Mother   . Heart failure Mother   . Breast cancer Mother 874      BRCA -, mastectomy right breast  . Alcohol abuse Father   . Hypertension Sister   . Hyperlipidemia Sister   . Hyperlipidemia Brother   . Hypertension Brother   . Hyperlipidemia Maternal Uncle   . Hypertension Maternal Uncle   . Heart disease Maternal Uncle   . Stroke Paternal Grandmother   . Stroke Paternal Grandfather   . Hypertension Brother   . Hyperlipidemia Brother     ROS:  Pertinent items are noted in HPI.  Otherwise, a comprehensive ROS was negative.  Exam:   LMP 07/26/2005 (Approximate)    Ht Readings from Last 3 Encounters:  01/05/18 5' 6.25" (1.683 m)  12/30/16 _0  (1.676 m)  08/20/16 _1  (1.702 m)    General appearance: alert, cooperative and appears stated age Head: Normocephalic, without obvious abnormality, atraumatic Neck: no adenopathy, supple, symmetrical, trachea midline and thyroid  normal to inspection and palpation Lungs: clear to auscultation bilaterally Breasts: normal appearance, no masses or tenderness, No nipple retraction or dimpling, No nipple discharge or bleeding, No axillary or supraclavicular adenopathy Heart: regular rate and rhythm Abdomen: soft, non-tender; no masses,  no organomegaly Extremities: extremities normal, atraumatic, no cyanosis or edema Skin: Skin color, texture, turgor normal. No rashes or lesions Lymph nodes: Cervical, supraclavicular, and axillary nodes normal. No abnormal inguinal nodes palpated Neurologic: Grossly normal   Pelvic: External genitalia:  no lesions              Urethra:  normal appearing  urethra with no masses, tenderness or lesions              Bartholin's and Skene's: normal                 Vagina: normal appearing vagina with normal color and discharge, no lesions              Cervix: no cervical motion tenderness and no lesions              Pap taken: Yes.   Bimanual Exam:  Uterus:  normal size, contour, position, consistency, mobility, non-tender              Adnexa: normal adnexa and no mass, fullness, tenderness               Rectovaginal: Confirms               Anus:  normal sphincter tone, no lesions  Chaperone present: yes  A:  Well Woman with normal exam  Post menopausal no HRT  Atrophic Vaginitis using OTC Olive oil  PCP management of osteopenia and cholesterol  P:   Reviewed health and wellness pertinent to exam  Aware of need to advise if vaginal bleeding  Discussed atrophy finding and options for treatment with OTC Replens, estrogen cream. Will try OTC and advise if no change, may come in to have checked.  Continue to follow up with PCP as indicated  Pap smear: yes  counseled on breast self exam, mammography screening, feminine hygiene, adequate intake of calcium and vitamin D, diet and exercise  return annually or prn  An After Visit Summary was printed and given to the patient.

## 2019-05-02 ENCOUNTER — Other Ambulatory Visit: Payer: Self-pay

## 2019-05-02 ENCOUNTER — Encounter: Payer: Self-pay | Admitting: Certified Nurse Midwife

## 2019-05-02 ENCOUNTER — Other Ambulatory Visit (HOSPITAL_COMMUNITY)
Admission: RE | Admit: 2019-05-02 | Discharge: 2019-05-02 | Disposition: A | Discharge status: Home / Self Care | Payer: PRIVATE HEALTH INSURANCE | Source: Ambulatory Visit | Attending: Certified Nurse Midwife | Admitting: Certified Nurse Midwife

## 2019-05-02 ENCOUNTER — Ambulatory Visit: Payer: No Typology Code available for payment source | Admitting: Certified Nurse Midwife

## 2019-05-02 VITALS — BP 110/70 | HR 70 | Temp 97.4°F | Resp 16 | Ht 66.0 in | Wt 148.0 lb

## 2019-05-02 DIAGNOSIS — N952 Postmenopausal atrophic vaginitis: Secondary | ICD-10-CM | POA: Diagnosis not present

## 2019-05-02 DIAGNOSIS — Z01419 Encounter for gynecological examination (general) (routine) without abnormal findings: Secondary | ICD-10-CM

## 2019-05-02 DIAGNOSIS — Z124 Encounter for screening for malignant neoplasm of cervix: Secondary | ICD-10-CM | POA: Diagnosis not present

## 2019-05-02 NOTE — Patient Instructions (Signed)
Atrophic Vaginitis Atrophic vaginitis is a condition in which the tissues that line the vagina become dry and thin. This condition occurs in women who have stopped having their period. It is caused by a drop in a female hormone (estrogen). This hormone helps:  To keep the vagina moist.  To make a clear fluid. This clear fluid helps: ? To make the vagina ready for sex. ? To protect the vagina from infection. If the lining of the vagina is dry and thin, it may cause irritation, burning, or itchiness. It may also:  Make sex painful.  Make an exam of your vagina painful.  Cause bleeding.  Make you lose interest in sex.  Cause a burning feeling when you pee (urinate).  Cause a brown or yellow fluid to come from your vagina. Some women do not have symptoms. Follow these instructions at home: Medicines  Take over-the-counter and prescription medicines only as told by your doctor.  Do not use herbs or other medicines unless your doctor says it is okay.  Use medicines for for dryness. These include: ? Oils to make the vagina soft. ? Creams. ? Moisturizers. General instructions  Do not douche.  Do not use products that can make your vagina dry. These include: ? Scented sprays. ? Scented tampons. ? Scented soaps.  Sex can help increase blood flow and soften the tissue in the vagina. If it hurts to have sex: ? Tell your partner. ? Use products to make sex more comfortable. Use these only as told by your doctor. Contact a doctor if you:  Have discharge from the vagina that is different than usual.  Have a bad smell coming from your vagina.  Have new symptoms.  Do not get better.  Get worse. Summary  Atrophic vaginitis is a condition in which the lining of the vagina becomes dry and thin.  This condition affects women who have stopped having their periods.  Treatment may include using products that help make the vagina soft.  Call a doctor if do not get better with  treatment. This information is not intended to replace advice given to you by your health care provider. Make sure you discuss any questions you have with your health care provider. Document Released: 03/30/2008 Document Revised: 10/25/2017 Document Reviewed: 10/25/2017 Elsevier Patient Education  2020 [employer]  

## 2019-05-03 LAB — CYTOLOGY - PAP: Diagnosis: NEGATIVE

## 2019-06-24 ENCOUNTER — Other Ambulatory Visit: Payer: Self-pay

## 2019-06-24 ENCOUNTER — Emergency Department (HOSPITAL_COMMUNITY): Payer: No Typology Code available for payment source

## 2019-06-24 ENCOUNTER — Emergency Department (HOSPITAL_COMMUNITY)
Admission: EM | Admit: 2019-06-24 | Discharge: 2019-06-24 | Disposition: A | Discharge status: Home / Self Care | Payer: No Typology Code available for payment source | Attending: Emergency Medicine | Admitting: Emergency Medicine

## 2019-06-24 DIAGNOSIS — S99912A Unspecified injury of left ankle, initial encounter: Secondary | ICD-10-CM | POA: Diagnosis present

## 2019-06-24 DIAGNOSIS — R55 Syncope and collapse: Secondary | ICD-10-CM | POA: Diagnosis not present

## 2019-06-24 DIAGNOSIS — Y9301 Activity, walking, marching and hiking: Secondary | ICD-10-CM | POA: Insufficient documentation

## 2019-06-24 DIAGNOSIS — S92155A Nondisplaced avulsion fracture (chip fracture) of left talus, initial encounter for closed fracture: Secondary | ICD-10-CM | POA: Diagnosis not present

## 2019-06-24 DIAGNOSIS — W109XXA Fall (on) (from) unspecified stairs and steps, initial encounter: Secondary | ICD-10-CM | POA: Diagnosis not present

## 2019-06-24 DIAGNOSIS — I10 Essential (primary) hypertension: Secondary | ICD-10-CM | POA: Insufficient documentation

## 2019-06-24 DIAGNOSIS — Y999 Unspecified external cause status: Secondary | ICD-10-CM | POA: Insufficient documentation

## 2019-06-24 DIAGNOSIS — Y929 Unspecified place or not applicable: Secondary | ICD-10-CM | POA: Insufficient documentation

## 2019-06-24 DIAGNOSIS — Z79899 Other long term (current) drug therapy: Secondary | ICD-10-CM | POA: Insufficient documentation

## 2019-06-24 LAB — CBC WITH DIFFERENTIAL/PLATELET
Abs Immature Granulocytes: 0.05 10*3/uL (ref 0.00–0.07)
Basophils Absolute: 0 10*3/uL (ref 0.0–0.1)
Basophils Relative: 0 %
Eosinophils Absolute: 0.1 10*3/uL (ref 0.0–0.5)
Eosinophils Relative: 1 %
HCT: 37.6 % (ref 36.0–46.0)
Hemoglobin: 12.4 g/dL (ref 12.0–15.0)
Immature Granulocytes: 1 %
Lymphocytes Relative: 12 %
Lymphs Abs: 1.1 10*3/uL (ref 0.7–4.0)
MCH: 31 pg (ref 26.0–34.0)
MCHC: 33 g/dL (ref 30.0–36.0)
MCV: 94 fL (ref 80.0–100.0)
Monocytes Absolute: 0.5 10*3/uL (ref 0.1–1.0)
Monocytes Relative: 6 %
Neutro Abs: 7.5 10*3/uL (ref 1.7–7.7)
Neutrophils Relative %: 80 %
Platelets: 253 10*3/uL (ref 150–400)
RBC: 4 MIL/uL (ref 3.87–5.11)
RDW: 12 % (ref 11.5–15.5)
WBC: 9.3 10*3/uL (ref 4.0–10.5)
nRBC: 0 % (ref 0.0–0.2)

## 2019-06-24 LAB — BASIC METABOLIC PANEL
Anion gap: 8 (ref 5–15)
BUN: 11 mg/dL (ref 8–23)
CO2: 24 mmol/L (ref 22–32)
Calcium: 9.3 mg/dL (ref 8.9–10.3)
Chloride: 106 mmol/L (ref 98–111)
Creatinine, Ser: 0.96 mg/dL (ref 0.44–1.00)
GFR calc Af Amer: >60 mL/min (ref >60)
GFR calc non Af Amer: >60 mL/min (ref >60)
Glucose, Bld: 123 mg/dL — ABNORMAL HIGH (ref 70–99)
Potassium: 4.1 mmol/L (ref 3.5–5.1)
Sodium: 138 mmol/L (ref 135–145)

## 2019-06-24 MED ORDER — ACETAMINOPHEN 325 MG PO TABS: 650.0000 mg | ORAL_TABLET | Freq: Once | ORAL | Status: DC

## 2019-06-24 MED ORDER — SODIUM CHLORIDE 0.9 % IV BOLUS
1000.0000 mL | Freq: Once | INTRAVENOUS | Status: AC
  Administered 2019-06-24: 1000 mL via INTRAVENOUS

## 2019-06-24 NOTE — ED Notes (Signed)
Patient transported to X-ray 

## 2019-06-24 NOTE — Progress Notes (Signed)
Orthopedic Tech Progress Note Patient Details:  Mary Rush [birth year] [id number]  Ortho Devices Type of Ortho Device: CAM walker, Crutches Ortho Device/Splint Location: LLE Ortho Device/Splint Interventions: Adjustment, Application, Ordered   Post Interventions Patient Tolerated: Well Instructions Provided: Care of device, Poper ambulation with device, Adjustment of device   [name] 06/24/2019, 6:25 PM

## 2019-06-24 NOTE — ED Provider Notes (Signed)
[facility] EMERGENCY DEPARTMENT Provider Note   CSN: [id number] Arrival date & time: 06/24/19  1553     History   Chief Complaint No chief complaint on file.   HPI Mary Rush is a 63 y.o. female.     The history is provided by the patient. No language interpreter was used.  Ankle Pain Location:  Ankle Injury: yes   Mechanism of injury: fall   Fall:    Fall occurred:  Down stairs   Entrapped after fall: no   Ankle location:  L ankle Pain details:    Quality:  Aching   Radiates to:  Does not radiate   Severity:  Moderate   Onset quality:  Gradual   Duration:  1 hour   Timing:  Constant Chronicity:  New Dislocation: no   Foreign body present:  No foreign bodies Relieved by:  Nothing Worsened by:  Nothing Ineffective treatments:  None tried Associated symptoms: no back pain   Risk factors: no concern for non-accidental trauma   Pt reports she walked after fall and then sat down and passed out.  Pt denies any impact of her head.  No chest pain.  No other areas of injury  Past Medical History:  Diagnosis Date  . MVP (mitral valve prolapse)    per patient physician states she does not have this  . Osteoporosis 12/2014    Patient Active Problem List   Diagnosis Date Noted  . Hyperlipidemia LDL goal <70 03/25/2018  . Hypertension, essential 02/09/2017  . Osteoporosis without current pathological fracture 02/09/2017  . 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 Atrophic vaginitis 12/30/2016  . Cerebral aneurysm 12/01/2016  . 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 APPENDECTOMY  1976  . CESAREAN SECTION       OB History    Gravida  3   Para  3   Term  3   Preterm  0   AB  0   Living  3     SAB  0   TAB  0   Ectopic  0   Multiple  0   Live Births  3            Home Medications     Prior to Admission medications   Medication Sig Start Date End Date Taking? Authorizing Provider  alendronate (FOSAMAX) 70 MG tablet Take 70 mg by mouth once a week. Take with a full glass of water on an empty stomach.   Yes [provider]  amLODipine (NORVASC) 5 MG tablet Take 5 mg by mouth daily.  01/08/17  Yes [provider]  loratadine (CLARITIN) 10 MG tablet Take 10 mg by mouth daily.   Yes [provider]  rosuvastatin (CRESTOR) 5 MG tablet Take 5 mg by mouth every other day.    Yes [provider]    Family History Family History  Problem Relation Age of Onset  . Hyperlipidemia Mother   . Heart failure Mother   . Breast cancer Mother 88       BRCA -, mastectomy right breast  . Alcohol abuse Father   . Hypertension Sister   . Hyperlipidemia Sister   . Hyperlipidemia Brother   . Hypertension Brother   . Hyperlipidemia Maternal Uncle   . Hypertension Maternal Uncle   . Heart disease Maternal Uncle   . Stroke Paternal Grandmother   .   Stroke Paternal Grandfather   . Hypertension Brother   . Hyperlipidemia Brother     Social History Social History   Tobacco Use  . Smoking status: Never Smoker  . Smokeless tobacco: Never Used  Substance Use Topics  . Alcohol use: Yes    Alcohol/week: 1.0 - 2.0 standard drinks    Types: 1 - 2 Standard drinks or equivalent per week  . Drug use: No     Allergies   Patient has no known allergies.   Review of Systems Review of Systems  Musculoskeletal: Negative for back pain.  All other systems reviewed and are negative.    Physical Exam Updated Vital Signs BP 138/84   Pulse 63   Temp 97.7 F (36.5 C) (Oral)   Resp 14   LMP 07/26/2005 (Approximate)   SpO2 99%   Physical Exam Vitals signs and nursing note reviewed.  Constitutional:      Appearance: She is well-developed.  HENT:     Head: Normocephalic.  Neck:     Musculoskeletal: Normal range of motion.  Cardiovascular:     Rate  and Rhythm: Normal rate.     Pulses: Normal pulses.  Pulmonary:     Effort: Pulmonary effort is normal.  Abdominal:     General: There is no distension.  Musculoskeletal:        General: Swelling and tenderness present. No deformity.     Comments: Swollen ankle, pain with movement nv and ns intact.   Skin:    General: Skin is warm.  Neurological:     General: No focal deficit present.     Mental Status: She is alert and oriented to person, place, and time.  Psychiatric:        Mood and Affect: Mood normal.      ED Treatments / Results  Labs (all labs ordered are listed, but only abnormal results are displayed) Labs Reviewed  BASIC METABOLIC PANEL - Abnormal; Notable for the following components:      Result Value   Glucose, Bld 123 (*)    All other components within normal limits  CBC WITH DIFFERENTIAL/PLATELET    EKG EKG Interpretation  Date/Time:  Saturday June 24 2019 15:56:12 EDT Ventricular Rate:  60 PR Interval:    QRS Duration: 100 QT Interval:  430 QTC Calculation: 430 R Axis:   58 Text Interpretation:  Sinus rhythm No significant change since last tracing Confirmed by Lajean Saver [phone]) on 06/24/2019 4:00:46 PM   Radiology Dg Ankle Complete Left  Result Date: 06/24/2019 CLINICAL DATA:  Pain EXAM: LEFT ANKLE COMPLETE - 3+ VIEW COMPARISON:  None. FINDINGS: There is a small osseous fracture fragment projecting over the anterior talus. There is an ankle joint effusion. There is surrounding soft tissue swelling. There is no dislocation. IMPRESSION: Probable small avulsion fracture arising from the anterior talus with surrounding soft tissue swelling. Electronically Signed   By: Constance Holster M.D.   On: 06/24/2019 17:34    Procedures Procedures (including critical care time)  Medications Ordered in ED Medications  acetaminophen (TYLENOL) tablet 650 mg (has no administration in time range)  sodium chloride 0.9 % bolus 1,000 mL (1,000 mLs Intravenous  New Bag/Given 06/24/19 1745)     Initial Impression / Assessment and Plan / ED Course  I have reviewed the triage vital signs and the nursing notes.  Pertinent labs & imaging results that were available during my care of the patient were reviewed by me and considered in my medical decision  making (see chart for details).        MDM  Ekg reviewed, labs reviewed,  Pt given Iv fluids and tylenol.  Pt advised of xray results.  Pt placed in a cam walker and given crutches.  Pt advised to follow up with Orthopaedist for evaluation   Final Clinical Impressions(s) / ED Diagnoses   Final diagnoses:  Closed nondisplaced avulsion fracture of left talus, initial encounter  Syncope, unspecified syncope type    ED Discharge Orders    None    An After Visit Summary was printed and given to the patient.    Fransico Meadow, [place] 06/24/19 1814    Lajean Saver, MD 06/26/19 1318

## 2019-06-24 NOTE — ED Notes (Signed)
Pt verbalized understanding of discharge paperwork and follow-up care.  °

## 2019-06-24 NOTE — ED Triage Notes (Signed)
Pt walked to get mail and twisted her ankle which brought her to her knees, then she had a syncpol episode and that was the last thing she remembers. States she felt fine prior with no illnesses.  On arrival alert and oriented with noted ankle pain and swelling.

## 2019-06-24 NOTE — Discharge Instructions (Signed)
Ice to area of pain.  Elevate, Tylenol for pain

## 2019-06-27 ENCOUNTER — Encounter: Payer: Self-pay | Admitting: Orthopaedic Surgery

## 2019-06-27 ENCOUNTER — Ambulatory Visit (INDEPENDENT_AMBULATORY_CARE_PROVIDER_SITE_OTHER): Payer: No Typology Code available for payment source | Admitting: Orthopaedic Surgery

## 2019-06-27 DIAGNOSIS — S92152A Displaced avulsion fracture (chip fracture) of left talus, initial encounter for closed fracture: Secondary | ICD-10-CM | POA: Diagnosis not present

## 2019-06-27 NOTE — Progress Notes (Signed)
Office Visit Note   Patient: Mary Rush           Date of Birth: November 04, 1955           MRN: [id number] Visit Date: 06/27/2019              Requested by: Shon Baton, [address] [place],  [place] [address] PCP: Shon Baton, MD   Assessment & Plan: Visit Diagnoses:  1. Closed displaced avulsion fracture of left talus, initial encounter     Plan: Impression is left talus avulsion fracture.  We will give her an ASO brace to wear to bed.  She may wean out of the Cam boot into the ASO as soon as she feels comfortable doing so.  I did give her a prescription for physical therapy she feels like she needs some strengthening.  She is instructed to follow-up in 6 weeks if she is not feeling significantly better.  Ice, elevation, activity restrictions were reviewed today.  Follow-Up Instructions: Return if symptoms worsen or fail to improve.   Orders:  No orders of the defined types were placed in this encounter.  No orders of the defined types were placed in this encounter.     Procedures: No procedures performed   Clinical Data: No additional findings.   Subjective: Chief Complaint  Patient presents with  . Left Foot - Injury    Mary Rush is a very pleasant 63 year old female comes in for evaluation of acute left ankle injury.  She rolled her ankle couple days ago when she missed stepped on a rain gutter.  X-ray showed avulsion fracture to the talar neck.  She does not have any significant pain.  Denies any numbness and tingling.  She does have swelling.   Review of Systems  Constitutional: Negative.   HENT: Negative.   Eyes: Negative.   Respiratory: Negative.   Cardiovascular: Negative.   Endocrine: Negative.   Musculoskeletal: Negative.   Neurological: Negative.   Hematological: Negative.   Psychiatric/Behavioral: Negative.   All other systems reviewed and are negative.    Objective: Vital Signs: LMP 07/26/2005 (Approximate)   Physical Exam Vitals  signs and nursing note reviewed.  Constitutional:      Appearance: She is well-developed.  HENT:     Head: Normocephalic and atraumatic.  Neck:     Musculoskeletal: Neck supple.  Pulmonary:     Effort: Pulmonary effort is normal.  Abdominal:     Palpations: Abdomen is soft.  Skin:    General: Skin is warm.     Capillary Refill: Capillary refill takes less than 2 seconds.  Neurological:     Mental Status: She is alert and oriented to person, place, and time.  Psychiatric:        Behavior: Behavior normal.        Thought Content: Thought content normal.        Judgment: Judgment normal.     Ortho Exam Left ankle exam shows moderate swelling.  Neurovascular intact.  She has tender across the anterior aspect of the ankle.  Fibula is nontender. Specialty Comments:  No specialty comments available.  Imaging: No results found.   PMFS History: Patient Active Problem List   Diagnosis Date Noted  . Closed displaced avulsion fracture of left talus 06/27/2019  . Hyperlipidemia LDL goal <70 03/25/2018  . Hypertension, essential 02/09/2017  . Osteoporosis without current pathological fracture 02/09/2017  . 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 Atrophic vaginitis 12/30/2016  .  Cerebral aneurysm 12/01/2016  . 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 MVP (mitral valve prolapse)    per patient physician states she does not have this  . Osteoporosis 12/2014    Family History  Problem Relation Age of Onset  . Hyperlipidemia Mother   . Heart failure Mother   . Breast cancer Mother 45       BRCA -, mastectomy right breast  . Alcohol abuse Father   . Hypertension Sister   . Hyperlipidemia Sister   . Hyperlipidemia Brother   . Hypertension Brother   . Hyperlipidemia Maternal Uncle   . Hypertension Maternal Uncle   . Heart disease Maternal Uncle   . Stroke  Paternal Grandmother   . Stroke Paternal Grandfather   . Hypertension Brother   . Hyperlipidemia Brother     Past Surgical History:  Procedure Laterality Date  . APPENDECTOMY  1976  . CESAREAN SECTION     Social History   Occupational History  . Not on file  Tobacco Use  . Smoking status: Never Smoker  . Smokeless tobacco: Never Used  Substance and Sexual Activity  . Alcohol use: Yes    Alcohol/week: 1.0 - 2.0 standard drinks    Types: 1 - 2 Standard drinks or equivalent per week  . Drug use: No  . Sexual activity: Not Currently    Partners: Male    Birth control/protection: Post-menopausal

## 2019-06-29 ENCOUNTER — Ambulatory Visit: Payer: No Typology Code available for payment source | Admitting: Orthopedic Surgery

## 2020-01-16 ENCOUNTER — Encounter: Payer: Self-pay | Admitting: Certified Nurse Midwife

## 2020-01-29 ENCOUNTER — Other Ambulatory Visit (HOSPITAL_COMMUNITY): Payer: Self-pay | Admitting: Internal Medicine

## 2020-01-29 DIAGNOSIS — I351 Nonrheumatic aortic (valve) insufficiency: Secondary | ICD-10-CM

## 2020-01-31 ENCOUNTER — Ambulatory Visit (HOSPITAL_COMMUNITY): Payer: Medicare Other | Attending: Cardiology

## 2020-01-31 ENCOUNTER — Other Ambulatory Visit: Payer: Self-pay

## 2020-01-31 DIAGNOSIS — I351 Nonrheumatic aortic (valve) insufficiency: Secondary | ICD-10-CM

## 2020-04-23 ENCOUNTER — Other Ambulatory Visit: Payer: Self-pay | Admitting: Internal Medicine

## 2020-04-23 DIAGNOSIS — Z1231 Encounter for screening mammogram for malignant neoplasm of breast: Secondary | ICD-10-CM

## 2020-04-24 ENCOUNTER — Other Ambulatory Visit: Payer: Self-pay | Admitting: Psychiatry

## 2020-04-24 DIAGNOSIS — I671 Cerebral aneurysm, nonruptured: Secondary | ICD-10-CM

## 2020-04-26 ENCOUNTER — Other Ambulatory Visit: Payer: Self-pay | Admitting: Internal Medicine

## 2020-04-26 DIAGNOSIS — M810 Age-related osteoporosis without current pathological fracture: Secondary | ICD-10-CM

## 2020-05-03 ENCOUNTER — Other Ambulatory Visit: Payer: Self-pay

## 2020-05-03 ENCOUNTER — Ambulatory Visit
Admission: RE | Admit: 2020-05-03 | Discharge: 2020-05-03 | Disposition: A | Discharge status: Home / Self Care | Payer: Medicare Other | Source: Ambulatory Visit

## 2020-05-03 ENCOUNTER — Ambulatory Visit: Payer: No Typology Code available for payment source | Admitting: Certified Nurse Midwife

## 2020-05-03 DIAGNOSIS — Z1231 Encounter for screening mammogram for malignant neoplasm of breast: Secondary | ICD-10-CM

## 2020-05-22 ENCOUNTER — Other Ambulatory Visit: Payer: Self-pay

## 2020-05-22 ENCOUNTER — Ambulatory Visit
Admission: RE | Admit: 2020-05-22 | Discharge: 2020-05-22 | Disposition: A | Discharge status: Home / Self Care | Payer: Medicare Other | Source: Ambulatory Visit | Attending: Psychiatry | Admitting: Psychiatry

## 2020-05-22 DIAGNOSIS — I671 Cerebral aneurysm, nonruptured: Secondary | ICD-10-CM

## 2020-05-23 ENCOUNTER — Encounter: Payer: Self-pay | Admitting: Obstetrics and Gynecology

## 2020-05-23 ENCOUNTER — Ambulatory Visit (INDEPENDENT_AMBULATORY_CARE_PROVIDER_SITE_OTHER): Payer: Medicare Other | Admitting: Obstetrics and Gynecology

## 2020-05-23 VITALS — BP 118/64 | HR 80 | Resp 14 | Ht 66.25 in | Wt 149.1 lb

## 2020-05-23 DIAGNOSIS — N952 Postmenopausal atrophic vaginitis: Secondary | ICD-10-CM | POA: Diagnosis not present

## 2020-05-23 DIAGNOSIS — E7800 Pure hypercholesterolemia, unspecified: Secondary | ICD-10-CM | POA: Diagnosis not present

## 2020-05-23 DIAGNOSIS — Z8679 Personal history of other diseases of the circulatory system: Secondary | ICD-10-CM | POA: Insufficient documentation

## 2020-05-23 DIAGNOSIS — Z01419 Encounter for gynecological examination (general) (routine) without abnormal findings: Secondary | ICD-10-CM | POA: Diagnosis not present

## 2020-05-23 DIAGNOSIS — Z124 Encounter for screening for malignant neoplasm of cervix: Secondary | ICD-10-CM | POA: Diagnosis not present

## 2020-05-23 DIAGNOSIS — R6882 Decreased libido: Secondary | ICD-10-CM

## 2020-05-23 NOTE — Progress Notes (Signed)
64 y.o. G50P3003 Married White or Caucasian Not Hispanic or Latino female here for annual exam.  She is sexually active, has some dryness. She uses a vaginal moisturizer and lubricant which helps. No vaginal bleeding. No bowel or baldder issues.  Strong FH of aneurysms, she is monitored with a small aneurysm.  Just had a MRI yesterday which is stable.  She does c/o a decreased libido.     Patient's last menstrual period was 07/26/2005 (approximate).          Sexually active: Yes.    The current method of family planning is post menopausal status.    Exercising: Yes.    walking 4-5 miles a day  Smoker:  no  Health Maintenance: Pap:  05-02-2019 negative           01-05-18 negative, HR HPV negative  History of abnormal Pap:  no MMG:  05-03-20 density C/BIRADS 1 negative  BMD:   02-26-17 osteoporosis, T score -2.6. DEXA scheduled for 06-14-20. Managed by primary MD Colonoscopy: 2008, cologuard 3 years ago with PCP TDaP:  2016   reports that she has never smoked. She has never used smokeless tobacco. She reports current alcohol use of about 1.0 - 2.0 standard drink of alcohol per week. She reports that she does not use drugs. Retired, ran an Systems developer. Moved here from [place] in 2008. 3 granddaughters, all local. 5 grand children.   Past Medical History:  Diagnosis Date  . MVP (mitral valve prolapse)    per patient physician states she does not have this  . Osteoporosis 12/2014    Past Surgical History:  Procedure Laterality Date  . APPENDECTOMY  1976  . CESAREAN SECTION      Current Outpatient Medications  Medication Sig Dispense Refill  . alendronate (FOSAMAX) 70 MG tablet Take 70 mg by mouth once a week. Take with a full glass of water on an empty stomach.    Marland Kitchen amLODipine (NORVASC) 10 MG tablet Take 10 mg by mouth daily.    . rosuvastatin (CRESTOR) 5 MG tablet Take 5 mg by mouth every other day.     Marland Kitchen VITAMIN D PO Take 1,000 Int'l Units by mouth.     No current  facility-administered medications for this visit.    Family History  Problem Relation Age of Onset  . Hyperlipidemia Mother   . Heart failure Mother   . Breast cancer Mother 90       BRCA -, mastectomy right breast  . Alcohol abuse Father   . Hypertension Sister   . Hyperlipidemia Sister   . Hyperlipidemia Brother   . Hypertension Brother   . Hyperlipidemia Maternal Uncle   . Hypertension Maternal Uncle   . Heart disease Maternal Uncle   . Stroke Paternal Grandmother   . Stroke Paternal Grandfather   . Hypertension Brother   . Hyperlipidemia Brother     Review of Systems  Constitutional: Negative.   HENT: Negative.   Eyes: Negative.   Respiratory: Negative.   Cardiovascular: Negative.   Gastrointestinal: Negative.   Endocrine: Negative.   Genitourinary: Negative.   Musculoskeletal: Negative.   Skin: Negative.   Allergic/Immunologic: Negative.   Neurological: Negative.   Hematological: Negative.   Psychiatric/Behavioral: Negative.     Exam:   BP (!) 118/64 (BP Location: Right Arm, Patient Position: Sitting, Cuff Size: Normal)   Pulse 80   Resp 14   Ht 5' 6.25" (1.683 m)   Wt 149 lb 1.6 oz (67.6 kg)  LMP 07/26/2005 (Approximate)   BMI 23.88 kg/m   Weight change: '@WEIGHTCHANGE' @ Height:   Height: 5' 6.25" (168.3 cm)  Ht Readings from Last 3 Encounters:  05/23/20 5' 6.25" (1.683 m)  05/02/19 '5\' 6"'  (1.676 m)  01/05/18 5' 6.25" (1.683 m)    General appearance: alert, cooperative and appears stated age Head: Normocephalic, without obvious abnormality, atraumatic Neck: no adenopathy, supple, symmetrical, trachea midline and thyroid normal to inspection and palpation Lungs: clear to auscultation bilaterally Cardiovascular: regular rate and rhythm Breasts: normal appearance, no masses or tenderness. Inverted nipples, always.  Abdomen: soft, non-tender; non distended,  no masses,  no organomegaly Extremities: extremities normal, atraumatic, no cyanosis or  edema Skin: Skin color, texture, turgor normal. No rashes or lesions Lymph nodes: Cervical, supraclavicular, and axillary nodes normal. No abnormal inguinal nodes palpated Neurologic: Grossly normal   Pelvic: External genitalia:  no lesions              Urethra:  normal appearing urethra with no masses, tenderness or lesions              Bartholins and Skenes: normal                 Vagina: atrophic appearing vagina with normal color and discharge, no lesions              Cervix: no lesions               Bimanual Exam:  Uterus:  normal size, contour, position, consistency, mobility, non-tender              Adnexa: no mass, fullness, tenderness               Rectovaginal: Confirms               Anus:  normal sphincter tone, no lesions  Olene Floss chaperoned for the exam.  A:  Well Woman with normal exam  Vaginal atrophy, using OTC products which help. Discussed vaginal estrogen, she declines   Low libido, information given  P:   No pap this year  Mammogram utd  Discussed breast self exam  Discussed calcium and vit D intake  Labs with primary  DEXA with primary  Cologuard with primary

## 2020-05-23 NOTE — Patient Instructions (Signed)
EXERCISE AND DIET:  We recommended that you start or continue a regular exercise program for good health. Regular exercise means any activity that makes your heart beat faster and makes you sweat.  We recommend exercising at least 30 minutes per day at least 3 days a week, preferably 4 or 5.  We also recommend a diet low in fat and sugar.  Inactivity, poor dietary choices and obesity can cause diabetes, heart attack, stroke, and kidney damage, among others.    ALCOHOL AND SMOKING:  Women should limit their alcohol intake to no more than 7 drinks/beers/glasses of wine (combined, not each!) per week. Moderation of alcohol intake to this level decreases your risk of breast cancer and liver damage. And of course, no recreational drugs are part of a healthy lifestyle.  And absolutely no smoking or even second hand smoke. Most people know smoking can cause heart and lung diseases, but did you know it also contributes to weakening of your bones? Aging of your skin?  Yellowing of your teeth and nails?  CALCIUM AND VITAMIN D:  Adequate intake of calcium and Vitamin D are recommended.  The recommendations for exact amounts of these supplements seem to change often, but generally speaking 1,200 mg of calcium (between diet and supplement) and 800 units of Vitamin D per day seems prudent. Certain women may benefit from higher intake of Vitamin D.  If you are among these women, your doctor will have told you during your visit.    PAP SMEARS:  Pap smears, to check for cervical cancer or precancers,  have traditionally been done yearly, although recent scientific advances have shown that most women can have pap smears less often.  However, every woman still should have a physical exam from her gynecologist every year. It will include a breast check, inspection of the vulva and vagina to check for abnormal growths or skin changes, a visual exam of the cervix, and then an exam to evaluate the size and shape of the uterus and  ovaries.  And after 65 years of age, a rectal exam is indicated to check for rectal cancers. We will also provide age appropriate advice regarding health maintenance, like when you should have certain vaccines, screening for sexually transmitted diseases, bone density testing, colonoscopy, mammograms, etc.   MAMMOGRAMS:  All women over 40 years old should have a yearly mammogram. Many facilities now offer a "3D" mammogram, which may cost around $50 extra out of pocket. If possible,  we recommend you accept the option to have the 3D mammogram performed.  It both reduces the number of women who will be called back for extra views which then turn out to be normal, and it is better than the routine mammogram at detecting truly abnormal areas.    COLON CANCER SCREENING: Now recommend starting at age 45. At this time colonoscopy is not covered for routine screening until 50. There are take home tests that can be done between 45-49.   COLONOSCOPY:  Colonoscopy to screen for colon cancer is recommended for all women at age 50.  We know, you hate the idea of the prep.  We agree, BUT, having colon cancer and not knowing it is worse!!  Colon cancer so often starts as a polyp that can be seen and removed at colonscopy, which can quite literally save your life!  And if your first colonoscopy is normal and you have no family history of colon cancer, most women don't have to have it again for   10 years.  Once every ten years, you can do something that may end up saving your life, right?  We will be happy to help you get it scheduled when you are ready.  Be sure to check your insurance coverage so you understand how much it will cost.  It may be covered as a preventative service at no cost, but you should check your particular policy.      Breast Self-Awareness Breast self-awareness means being familiar with how your breasts look and feel. It involves checking your breasts regularly and reporting any changes to your  health care provider. Practicing breast self-awareness is important. A change in your breasts can be a sign of a serious medical problem. Being familiar with how your breasts look and feel allows you to find any problems early, when treatment is more likely to be successful. All women should practice breast self-awareness, including women who have had breast implants. How to do a breast self-exam One way to learn what is normal for your breasts and whether your breasts are changing is to do a breast self-exam. To do a breast self-exam: Look for Changes  1. Remove all the clothing above your waist. 2. Stand in front of a mirror in a room with good lighting. 3. Put your hands on your hips. 4. Push your hands firmly downward. 5. Compare your breasts in the mirror. Look for differences between them (asymmetry), such as: ? Differences in shape. ? Differences in size. ? Puckers, dips, and bumps in one breast and not the other. 6. Look at each breast for changes in your skin, such as: ? Redness. ? Scaly areas. 7. Look for changes in your nipples, such as: ? Discharge. ? Bleeding. ? Dimpling. ? Redness. ? A change in position. Feel for Changes Carefully feel your breasts for lumps and changes. It is best to do this while lying on your back on the floor and again while sitting or standing in the shower or tub with soapy water on your skin. Feel each breast in the following way:  Place the arm on the side of the breast you are examining above your head.  Feel your breast with the other hand.  Start in the nipple area and make  inch (2 cm) overlapping circles to feel your breast. Use the pads of your three middle fingers to do this. Apply light pressure, then medium pressure, then firm pressure. The light pressure will allow you to feel the tissue closest to the skin. The medium pressure will allow you to feel the tissue that is a little deeper. The firm pressure will allow you to feel the tissue  close to the ribs.  Continue the overlapping circles, moving downward over the breast until you feel your ribs below your breast.  Move one finger-width toward the center of the body. Continue to use the  inch (2 cm) overlapping circles to feel your breast as you move slowly up toward your collarbone.  Continue the up and down exam using all three pressures until you reach your armpit.  Write Down What You Find  Write down what is normal for each breast and any changes that you find. Keep a written record with breast changes or normal findings for each breast. By writing this information down, you do not need to depend only on memory for size, tenderness, or location. Write down where you are in your menstrual cycle, if you are still menstruating. If you are having trouble noticing differences   in your breasts, do not get discouraged. With time you will become more familiar with the variations in your breasts and more comfortable with the exam. How often should I examine my breasts? Examine your breasts every month. If you are breastfeeding, the best time to examine your breasts is after a feeding or after using a breast pump. If you menstruate, the best time to examine your breasts is 5-7 days after your period is over. During your period, your breasts are lumpier, and it may be more difficult to notice changes. When should I see my health care provider? See your health care provider if you notice:  A change in shape or size of your breasts or nipples.  A change in the skin of your breast or nipples, such as a reddened or scaly area.  Unusual discharge from your nipples.  A lump or thick area that was not there before.  Pain in your breasts.  Anything that concerns you.  

## 2020-06-14 ENCOUNTER — Ambulatory Visit
Admission: RE | Admit: 2020-06-14 | Discharge: 2020-06-14 | Disposition: A | Discharge status: Home / Self Care | Payer: Medicare Other | Source: Ambulatory Visit | Attending: Internal Medicine | Admitting: Internal Medicine

## 2020-06-14 ENCOUNTER — Other Ambulatory Visit: Payer: Self-pay

## 2020-06-14 DIAGNOSIS — M810 Age-related osteoporosis without current pathological fracture: Secondary | ICD-10-CM

## 2021-01-27 DIAGNOSIS — E559 Vitamin D deficiency, unspecified: Secondary | ICD-10-CM | POA: Diagnosis not present

## 2021-01-27 DIAGNOSIS — E785 Hyperlipidemia, unspecified: Secondary | ICD-10-CM | POA: Diagnosis not present

## 2021-02-03 DIAGNOSIS — I351 Nonrheumatic aortic (valve) insufficiency: Secondary | ICD-10-CM | POA: Diagnosis not present

## 2021-02-03 DIAGNOSIS — M1990 Unspecified osteoarthritis, unspecified site: Secondary | ICD-10-CM | POA: Diagnosis not present

## 2021-02-03 DIAGNOSIS — E559 Vitamin D deficiency, unspecified: Secondary | ICD-10-CM | POA: Diagnosis not present

## 2021-02-03 DIAGNOSIS — I773 Arterial fibromuscular dysplasia: Secondary | ICD-10-CM | POA: Diagnosis not present

## 2021-02-03 DIAGNOSIS — Z0000 Encounter for general adult medical examination without abnormal findings: Secondary | ICD-10-CM | POA: Diagnosis not present

## 2021-02-03 DIAGNOSIS — D229 Melanocytic nevi, unspecified: Secondary | ICD-10-CM | POA: Diagnosis not present

## 2021-02-03 DIAGNOSIS — J302 Other seasonal allergic rhinitis: Secondary | ICD-10-CM | POA: Diagnosis not present

## 2021-02-03 DIAGNOSIS — I341 Nonrheumatic mitral (valve) prolapse: Secondary | ICD-10-CM | POA: Diagnosis not present

## 2021-02-03 DIAGNOSIS — M810 Age-related osteoporosis without current pathological fracture: Secondary | ICD-10-CM | POA: Diagnosis not present

## 2021-02-03 DIAGNOSIS — I10 Essential (primary) hypertension: Secondary | ICD-10-CM | POA: Diagnosis not present

## 2021-02-03 DIAGNOSIS — R82998 Other abnormal findings in urine: Secondary | ICD-10-CM | POA: Diagnosis not present

## 2021-02-03 DIAGNOSIS — E785 Hyperlipidemia, unspecified: Secondary | ICD-10-CM | POA: Diagnosis not present

## 2021-02-27 DIAGNOSIS — Z1211 Encounter for screening for malignant neoplasm of colon: Secondary | ICD-10-CM | POA: Diagnosis not present

## 2021-02-27 DIAGNOSIS — Z1212 Encounter for screening for malignant neoplasm of rectum: Secondary | ICD-10-CM | POA: Diagnosis not present

## 2021-03-08 DIAGNOSIS — J029 Acute pharyngitis, unspecified: Secondary | ICD-10-CM | POA: Diagnosis not present

## 2021-03-08 DIAGNOSIS — U071 COVID-19: Secondary | ICD-10-CM | POA: Diagnosis not present

## 2021-04-15 DIAGNOSIS — R21 Rash and other nonspecific skin eruption: Secondary | ICD-10-CM | POA: Diagnosis not present

## 2021-05-05 DIAGNOSIS — E785 Hyperlipidemia, unspecified: Secondary | ICD-10-CM | POA: Diagnosis not present

## 2021-05-29 ENCOUNTER — Ambulatory Visit: Payer: Medicare Other | Admitting: Obstetrics and Gynecology

## 2021-07-02 DIAGNOSIS — D3131 Benign neoplasm of right choroid: Secondary | ICD-10-CM | POA: Diagnosis not present

## 2022-02-09 DIAGNOSIS — E785 Hyperlipidemia, unspecified: Secondary | ICD-10-CM | POA: Diagnosis not present

## 2022-02-09 DIAGNOSIS — I10 Essential (primary) hypertension: Secondary | ICD-10-CM | POA: Diagnosis not present

## 2022-02-09 DIAGNOSIS — E559 Vitamin D deficiency, unspecified: Secondary | ICD-10-CM | POA: Diagnosis not present

## 2022-02-10 DIAGNOSIS — H903 Sensorineural hearing loss, bilateral: Secondary | ICD-10-CM | POA: Diagnosis not present

## 2022-02-16 DIAGNOSIS — I10 Essential (primary) hypertension: Secondary | ICD-10-CM | POA: Diagnosis not present

## 2022-02-16 DIAGNOSIS — I773 Arterial fibromuscular dysplasia: Secondary | ICD-10-CM | POA: Diagnosis not present

## 2022-02-16 DIAGNOSIS — Z0000 Encounter for general adult medical examination without abnormal findings: Secondary | ICD-10-CM | POA: Diagnosis not present

## 2022-02-16 DIAGNOSIS — M1990 Unspecified osteoarthritis, unspecified site: Secondary | ICD-10-CM | POA: Diagnosis not present

## 2022-02-16 DIAGNOSIS — I341 Nonrheumatic mitral (valve) prolapse: Secondary | ICD-10-CM | POA: Diagnosis not present

## 2022-02-16 DIAGNOSIS — I351 Nonrheumatic aortic (valve) insufficiency: Secondary | ICD-10-CM | POA: Diagnosis not present

## 2022-02-16 DIAGNOSIS — Z1339 Encounter for screening examination for other mental health and behavioral disorders: Secondary | ICD-10-CM | POA: Diagnosis not present

## 2022-02-16 DIAGNOSIS — E559 Vitamin D deficiency, unspecified: Secondary | ICD-10-CM | POA: Diagnosis not present

## 2022-02-16 DIAGNOSIS — Z1331 Encounter for screening for depression: Secondary | ICD-10-CM | POA: Diagnosis not present

## 2022-02-16 DIAGNOSIS — E785 Hyperlipidemia, unspecified: Secondary | ICD-10-CM | POA: Diagnosis not present

## 2022-02-16 DIAGNOSIS — M810 Age-related osteoporosis without current pathological fracture: Secondary | ICD-10-CM | POA: Diagnosis not present

## 2022-02-16 DIAGNOSIS — J302 Other seasonal allergic rhinitis: Secondary | ICD-10-CM | POA: Diagnosis not present

## 2022-02-19 ENCOUNTER — Other Ambulatory Visit: Payer: Self-pay | Admitting: Obstetrics and Gynecology

## 2022-02-19 DIAGNOSIS — Z1231 Encounter for screening mammogram for malignant neoplasm of breast: Secondary | ICD-10-CM

## 2022-02-23 ENCOUNTER — Ambulatory Visit
Admission: RE | Admit: 2022-02-23 | Discharge: 2022-02-23 | Disposition: A | Discharge status: Home / Self Care | Payer: Medicare Other | Source: Ambulatory Visit

## 2022-02-23 DIAGNOSIS — Z1231 Encounter for screening mammogram for malignant neoplasm of breast: Secondary | ICD-10-CM

## 2022-04-03 DIAGNOSIS — T161XXA Foreign body in right ear, initial encounter: Secondary | ICD-10-CM | POA: Diagnosis not present

## 2022-04-03 DIAGNOSIS — Z974 Presence of external hearing-aid: Secondary | ICD-10-CM | POA: Diagnosis not present

## 2022-05-16 IMAGING — MG DIGITAL SCREENING BILAT W/ TOMO W/ CAD
8 series · 9 of 24 positions shown · non-contrast
Comparison: Previous exam(s).

CLINICAL DATA: Screening.

EXAM:
DIGITAL SCREENING BILATERAL MAMMOGRAM WITH TOMO AND CAD

[L MLO synth-2D]
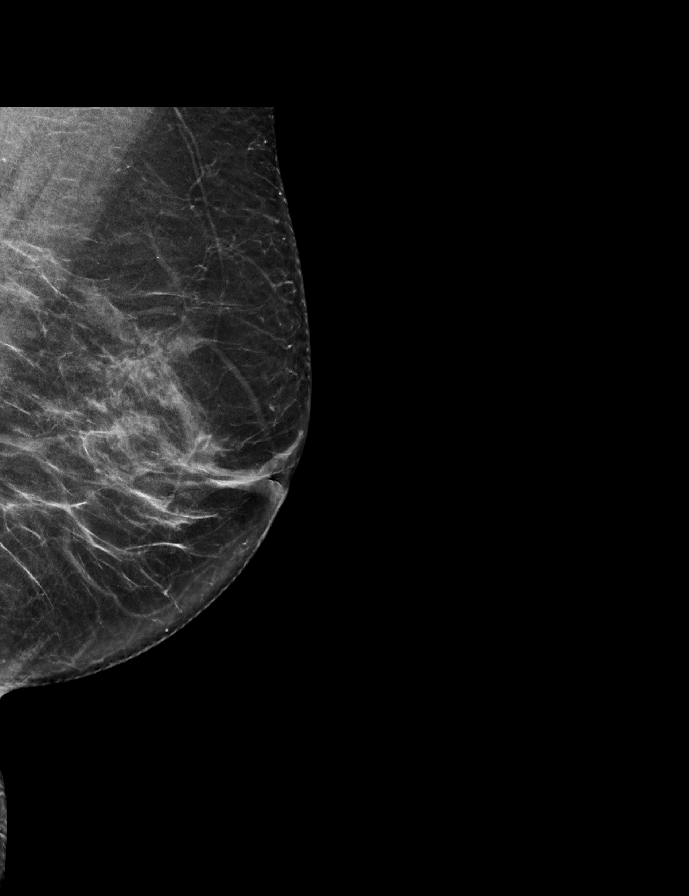

[R MLO synth-2D]
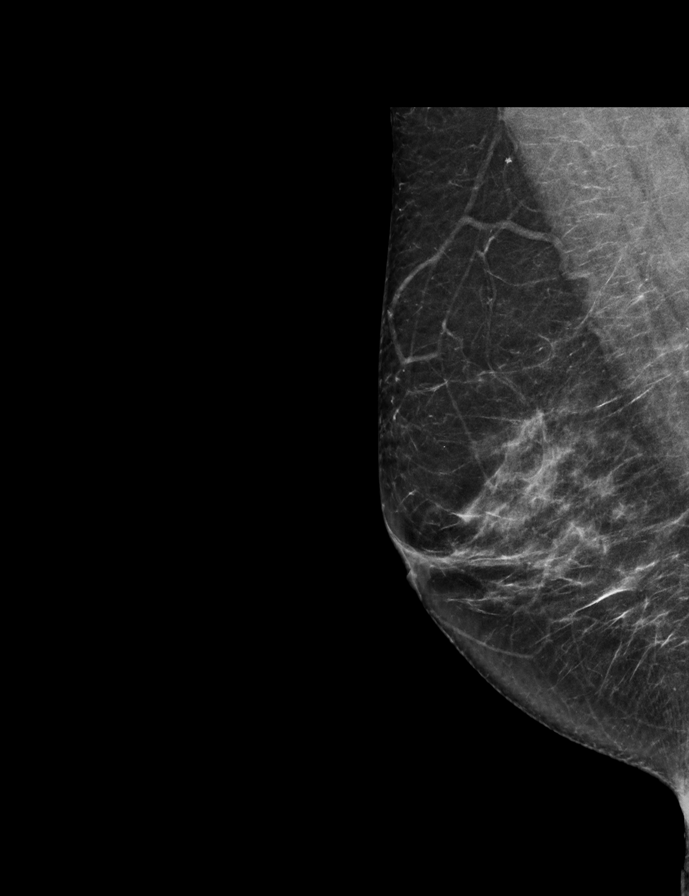

[L CC synth-2D]
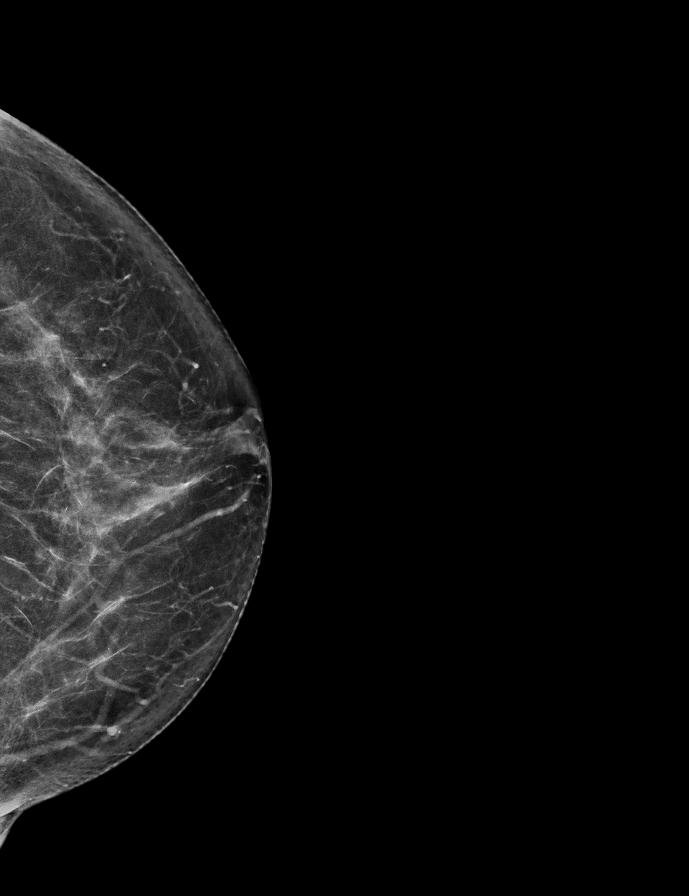

[R CC synth-2D]
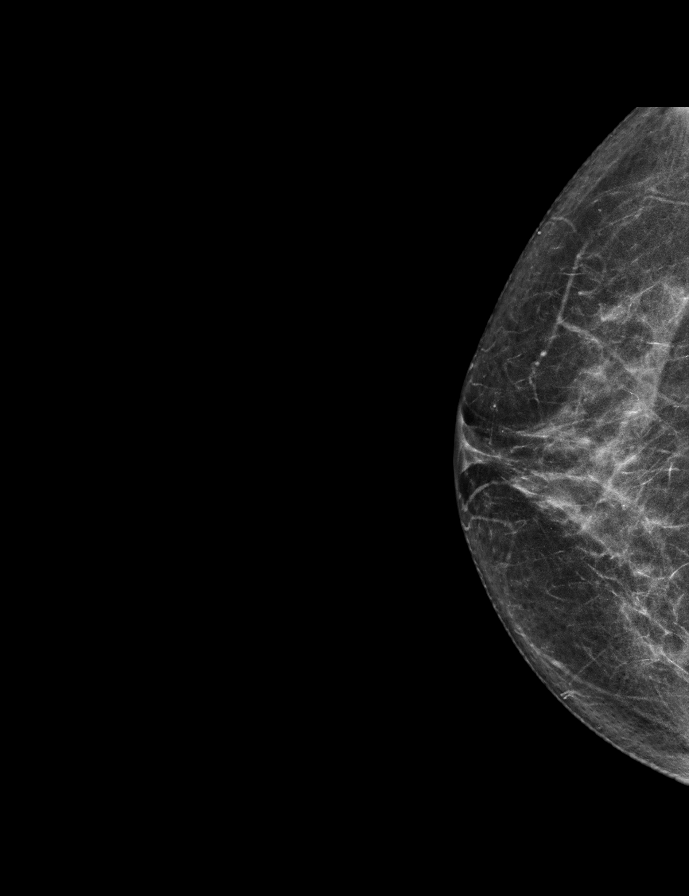

[R MLO tomo · 2 of 64 frames shown]
[frame 21/64]
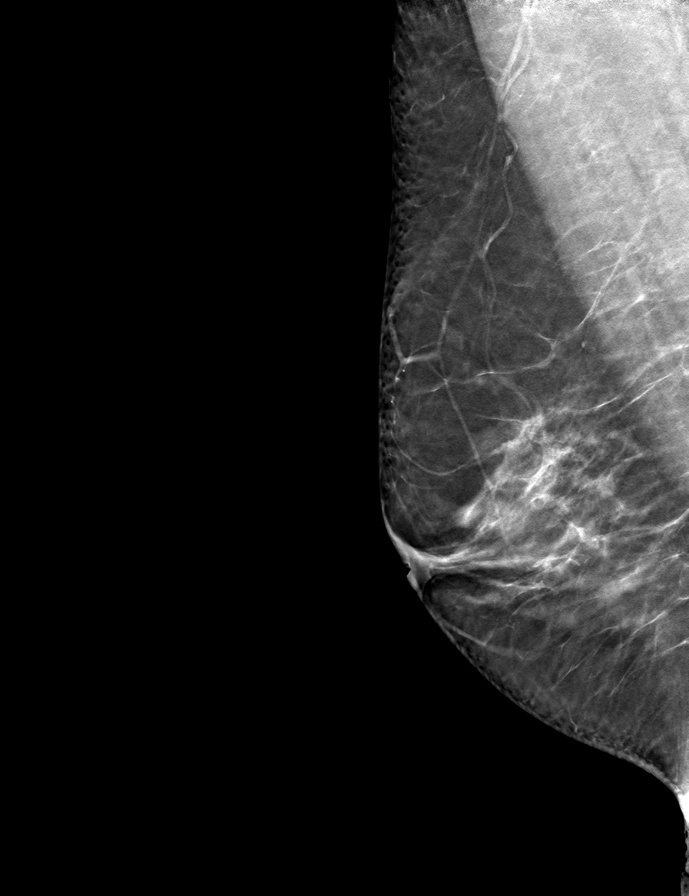
[frame 33/64]
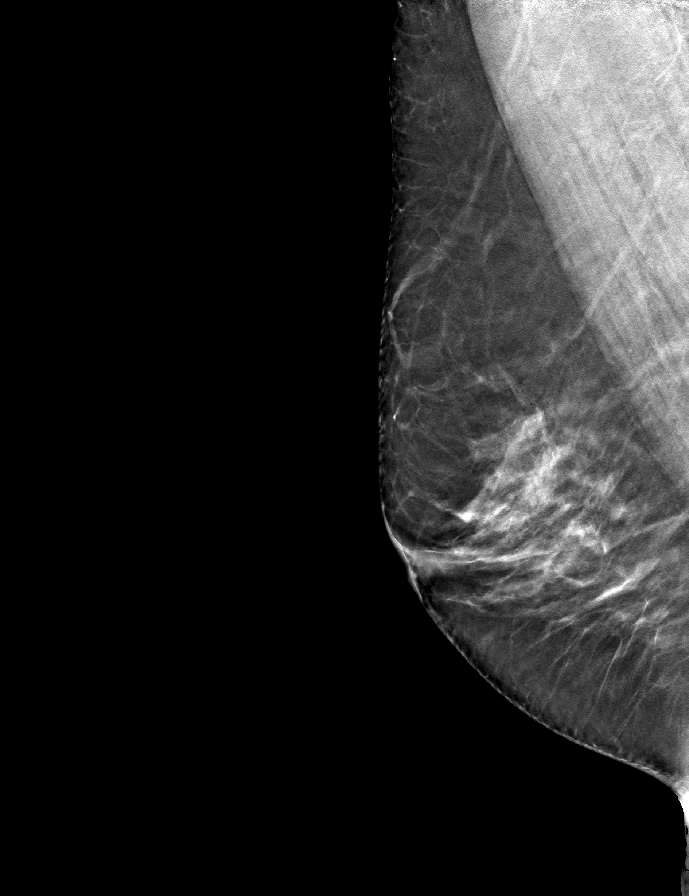

[L MLO tomo · tomo slice 33/65.0]
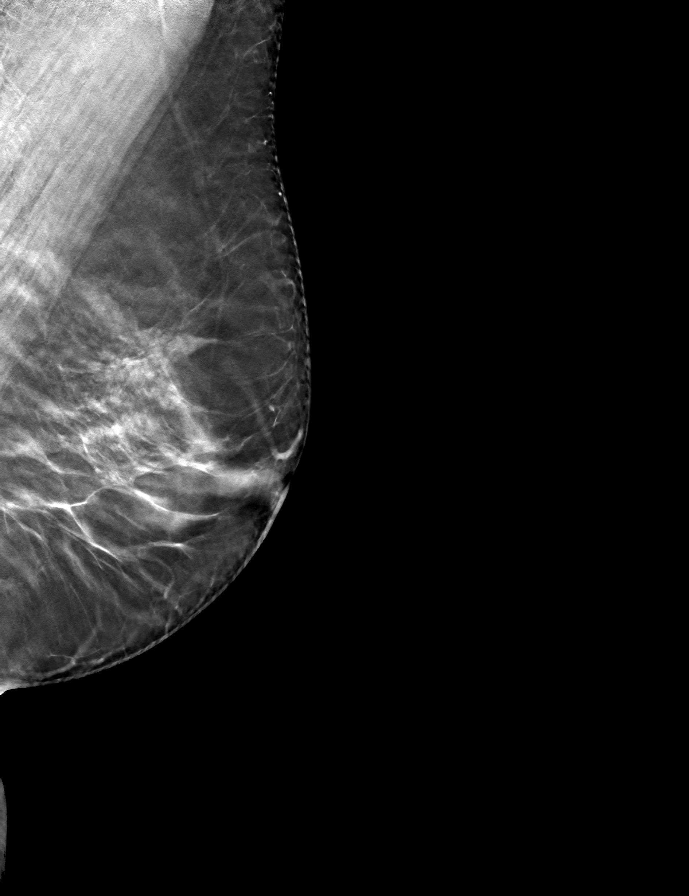

[L CC tomo · tomo slice 31/62.0]
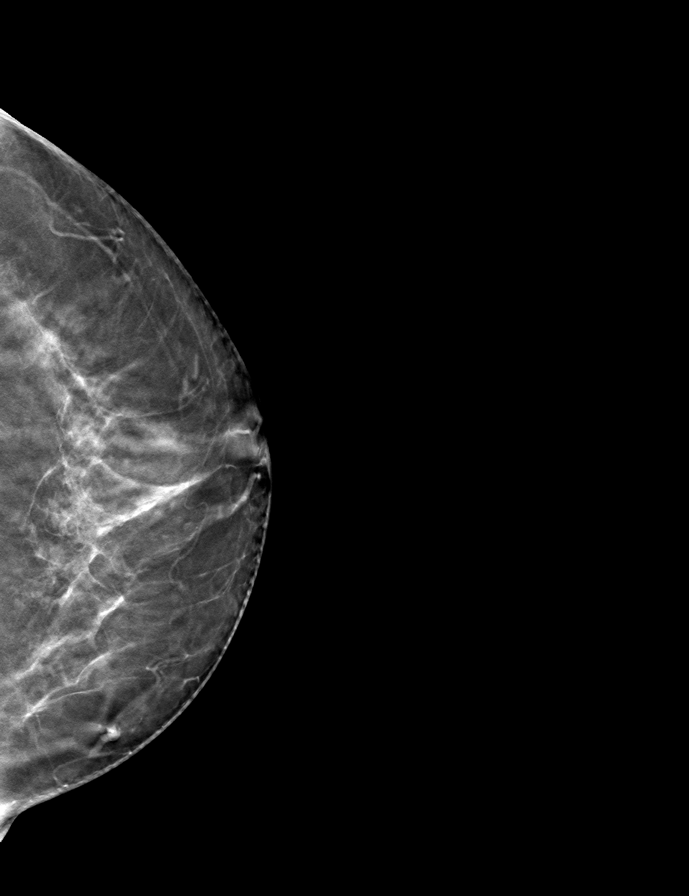

[R CC tomo · tomo slice 33/64.0]
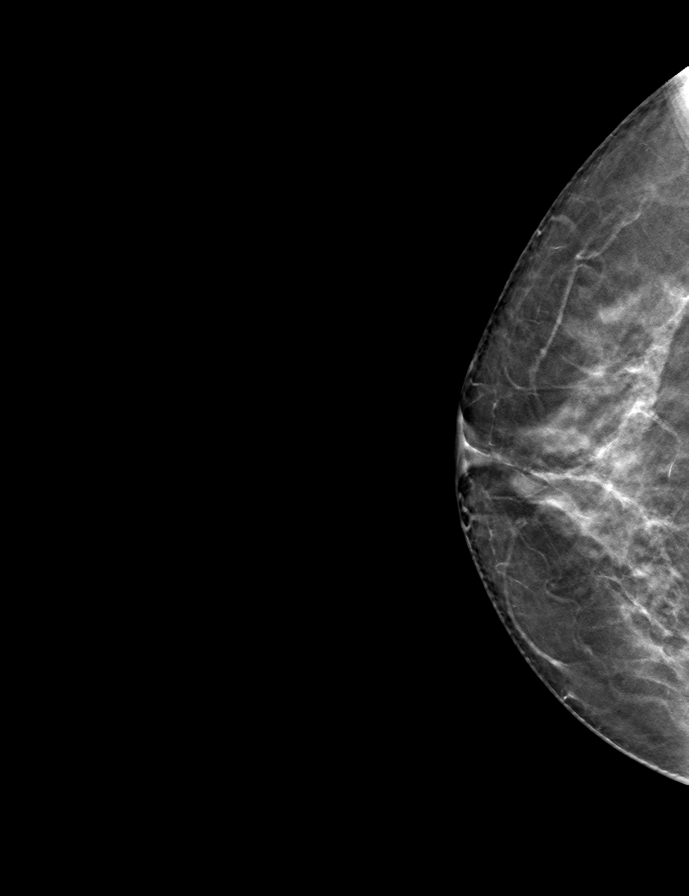

[9 of 24 positions shown; findings below may reference images not displayed]

ACR Breast Density Category c: The breast tissue is heterogeneously
dense, which may obscure small masses.
FINDINGS: There are no findings suspicious for malignancy. Images were
processed with CAD.
IMPRESSION: No mammographic evidence of malignancy. A result letter of this
screening mammogram will be mailed directly to the patient.

RECOMMENDATION:
Screening mammogram in one year. (Code:FT-U-LHB)

BI-RADS CATEGORY  1: Negative.

## 2022-05-19 NOTE — Progress Notes (Deleted)
66 y.o. G31P3003 Married White or Caucasian Not Hispanic or Latino female here for annual exam.      Patient's last menstrual period was 07/26/2005 (approximate).          Sexually active: {yes no:314532}  The current method of family planning is {contraception:315051}.    Exercising: {yes no:314532}  {types:19826} Smoker:  {YES P5382123  Health Maintenance: Pap:  05-02-2019 negative           01-05-18 negative, HR HPV negative  History of abnormal Pap:  no MMG:  02/23/22 Bi-rads 1 neg  BMD:   06/14/20 osteoporotic  Colonoscopy: 2008, cologuard 3 years ago with PCP TDaP:  2016  Gardasil: none    reports that she has never smoked. She has never used smokeless tobacco. She reports current alcohol use of about 1.0 - 2.0 standard drink of alcohol per week. She reports that she does not use drugs.  Past Medical History:  Diagnosis Date   Elevated cholesterol    History of aneurysm    History of hypertension    MVP (mitral valve prolapse)    per patient physician states she does not have this   Osteoporosis 12/2014    Past Surgical History:  Procedure Laterality Date   APPENDECTOMY  1976   CESAREAN SECTION      Current Outpatient Medications  Medication Sig Dispense Refill   alendronate (FOSAMAX) 70 MG tablet Take 70 mg by mouth once a week. Take with a full glass of water on an empty stomach.     amLODipine (NORVASC) 10 MG tablet Take 10 mg by mouth daily.     rosuvastatin (CRESTOR) 5 MG tablet Take 5 mg by mouth every other day.      VITAMIN D PO Take 1,000 Int'l Units by mouth.     No current facility-administered medications for this visit.    Family History  Problem Relation Age of Onset   Hyperlipidemia Mother    Heart failure Mother    Breast cancer Mother 13       BRCA -, mastectomy right breast   Alcohol abuse Father    Hypertension Sister    Hyperlipidemia Sister    Hyperlipidemia Brother    Hypertension Brother    Hyperlipidemia Maternal Uncle    Hypertension  Maternal Uncle    Heart disease Maternal Uncle    Stroke Paternal Grandmother    Stroke Paternal Grandfather    Hypertension Brother    Hyperlipidemia Brother     Review of Systems  Exam:   LMP 07/26/2005 (Approximate)   Weight change: '@WEIGHTCHANGE' @ Height:      Ht Readings from Last 3 Encounters:  05/23/20 5' 6.25" (1.683 m)  05/02/19 '5\' 6"'  (1.676 m)  01/05/18 5' 6.25" (1.683 m)    General appearance: alert, cooperative and appears stated age Head: Normocephalic, without obvious abnormality, atraumatic Neck: no adenopathy, supple, symmetrical, trachea midline and thyroid {CHL AMB PHY EX THYROID NORM DEFAULT:[phone]::"normal to inspection and palpation"} Lungs: clear to auscultation bilaterally Cardiovascular: regular rate and rhythm Breasts: {Exam; breast:13139::"normal appearance, no masses or tenderness"} Abdomen: soft, non-tender; non distended,  no masses,  no organomegaly Extremities: extremities normal, atraumatic, no cyanosis or edema Skin: Skin color, texture, turgor normal. No rashes or lesions Lymph nodes: Cervical, supraclavicular, and axillary nodes normal. No abnormal inguinal nodes palpated Neurologic: Grossly normal   Pelvic: External genitalia:  no lesions              Urethra:  normal appearing urethra with  no masses, tenderness or lesions              Bartholins and Skenes: normal                 Vagina: normal appearing vagina with normal color and discharge, no lesions              Cervix: {CHL AMB PHY EX CERVIX NORM [phone]::"no lesions"}               Bimanual Exam:  Uterus:  {CHL AMB PHY EX UTERUS NORM [phone]::"normal size, contour, position, consistency, mobility, non-tender"}              Adnexa: {CHL AMB PHY EX ADNEXA NO MASS DEFAULT:[phone]::"no mass, fullness, tenderness"}               Rectovaginal: Confirms               Anus:  normal sphincter tone, no lesions  *** chaperoned for the exam.  A:  Well Woman with  normal exam  P:

## 2022-05-28 ENCOUNTER — Ambulatory Visit: Payer: Medicare Other | Admitting: Obstetrics and Gynecology

## 2022-06-09 NOTE — Progress Notes (Signed)
66 y.o. G55P3003 Married White or Caucasian Not Hispanic or Latino female here for breast and pelvic exam.  She is wanting to know about atropic vaginal walls. She is wanting to know about what she can do about it. She is having vaginal dryness at times, uses vaginal moisturizer as needed. Not sexually active secondary to husbands medical issues.  No vaginal bleeding. No bowel or bladder issues.     Patient's last menstrual period was 07/26/2005 (approximate).          Sexually active: No.  The current method of family planning is post menopausal status.    Exercising: Yes.     4-5 miles daily 5 days a week.  Smoker:  no  Health Maintenance: Pap:   05-02-2019 negative           01-05-18 negative, HR HPV negative  History of abnormal Pap:  no MMG:  02/23/22 density C Bi-rads 1 neg  BMD:   06/14/20 osteoporotic. She went off Fosmax this year, was on it for 7 years.  Colonoscopy: 2008, cologuard in 5/22 years ago with PCP TDaP:  2016 Gardasil: n/a   reports that she has never smoked. She has never used smokeless tobacco. She reports current alcohol use of about 1.0 - 2.0 standard drink of alcohol per week. She reports that she does not use drugs. Has 3 daughters and 7 grandchildren. 24 month old grandson was just diagnosed with neurodevelopmental disorder. She helps take care of her grand kids.   Past Medical History:  Diagnosis Date   Elevated cholesterol    History of aneurysm    History of hypertension    MVP (mitral valve prolapse)    per patient physician states she does not have this   Osteoporosis 12/2014    Past Surgical History:  Procedure Laterality Date   APPENDECTOMY  1976   CESAREAN SECTION      Current Outpatient Medications  Medication Sig Dispense Refill   amLODipine (NORVASC) 10 MG tablet Take 10 mg by mouth daily.     aspirin EC 81 MG tablet Take by mouth.     rosuvastatin (CRESTOR) 5 MG tablet Take 5 mg by mouth every other day.      No current  facility-administered medications for this visit.    Family History  Problem Relation Age of Onset   Hyperlipidemia Mother    Heart failure Mother    Breast cancer Mother 3       BRCA -, mastectomy right breast   Alcohol abuse Father    Hypertension Sister    Hyperlipidemia Sister    Hyperlipidemia Brother    Hypertension Brother    Hyperlipidemia Maternal Uncle    Hypertension Maternal Uncle    Heart disease Maternal Uncle    Stroke Paternal Grandmother    Stroke Paternal Grandfather    Hypertension Brother    Hyperlipidemia Brother     Review of Systems  All other systems reviewed and are negative.   Exam:   BP 130/72   Pulse 88   Ht '5\' 7"'  (1.702 m)   Wt 151 lb (68.5 kg)   LMP 07/26/2005 (Approximate)   SpO2 100%   BMI 23.65 kg/m   Weight change: '@WEIGHTCHANGE' @ Height:   Height: '5\' 7"'  (170.2 cm)  Ht Readings from Last 3 Encounters:  06/18/22 '5\' 7"'  (1.702 m)  05/23/20 5' 6.25" (1.683 m)  05/02/19 '5\' 6"'  (1.676 m)    General appearance: alert, cooperative and appears stated age Head: Normocephalic,  without obvious abnormality, atraumatic Neck: no adenopathy, supple, symmetrical, trachea midline and thyroid normal to inspection and palpation Breasts: normal appearance, no masses or tenderness Abdomen: soft, non-tender; non distended,  no masses,  no organomegaly Extremities: extremities normal, atraumatic, no cyanosis or edema Skin: Skin color, texture, turgor normal. No rashes or lesions Lymph nodes: Cervical, supraclavicular, and axillary nodes normal. No abnormal inguinal nodes palpated Neurologic: Grossly normal   Pelvic: External genitalia:  no lesions              Urethra:  normal appearing urethra with no masses, tenderness or lesions              Bartholins and Skenes: normal                 Vagina: atrophic appearing vagina with normal color and discharge, no lesions. Only able to insert one finger vaginally              Cervix: no lesions                Bimanual Exam:  Uterus:   no masses or tenderness              Adnexa: no mass, fullness, tenderness               Rectovaginal: Confirms               Anus:  normal sphincter tone, no lesions  Gae Dry chaperoned for the exam.  1. Encounter for breast and pelvic examination Labs with primary Mammogram UTD Cologuard UTD  2. History of osteoporosis Discussed calcium and vit d intake Recently off of fosamax - DG Bone Density; Future  3. Hypoestrogenism - DG Bone Density; Future  4. Screening for cervical cancer - Cytology - PAP  5. Vaginal atrophy Discussed the option of vaginal estrogen. She will let me know if she wants to try it. We also discussed vaginal dilators, information given. I think she would need vaginal estrogen and to use dilators if she wanted to be sexually active  In addition to the breast and pelvic exam, ~20 minutes was spent in total patient care. Most of this was on discussion of vaginal atrophy, also discussed h/o osteoporosis and DEXA was ordered.

## 2022-06-18 ENCOUNTER — Other Ambulatory Visit (HOSPITAL_COMMUNITY)
Admission: RE | Admit: 2022-06-18 | Discharge: 2022-06-18 | Disposition: A | Discharge status: Home / Self Care | Payer: Medicare Other | Source: Ambulatory Visit | Attending: Obstetrics and Gynecology | Admitting: Obstetrics and Gynecology

## 2022-06-18 ENCOUNTER — Encounter: Payer: Self-pay | Admitting: Obstetrics and Gynecology

## 2022-06-18 ENCOUNTER — Ambulatory Visit (INDEPENDENT_AMBULATORY_CARE_PROVIDER_SITE_OTHER): Payer: Medicare Other | Admitting: Obstetrics and Gynecology

## 2022-06-18 VITALS — BP 130/72 | HR 88 | Ht 67.0 in | Wt 151.0 lb

## 2022-06-18 DIAGNOSIS — N952 Postmenopausal atrophic vaginitis: Secondary | ICD-10-CM

## 2022-06-18 DIAGNOSIS — Z8739 Personal history of other diseases of the musculoskeletal system and connective tissue: Secondary | ICD-10-CM | POA: Diagnosis not present

## 2022-06-18 DIAGNOSIS — E2839 Other primary ovarian failure: Secondary | ICD-10-CM | POA: Diagnosis not present

## 2022-06-18 DIAGNOSIS — Z124 Encounter for screening for malignant neoplasm of cervix: Secondary | ICD-10-CM | POA: Diagnosis not present

## 2022-06-18 DIAGNOSIS — Z01419 Encounter for gynecological examination (general) (routine) without abnormal findings: Secondary | ICD-10-CM | POA: Diagnosis not present

## 2022-06-18 DIAGNOSIS — Z8616 Personal history of COVID-19: Secondary | ICD-10-CM | POA: Insufficient documentation

## 2022-06-18 NOTE — Patient Instructions (Signed)

## 2022-06-22 LAB — CYTOLOGY - PAP: Diagnosis: NEGATIVE

## 2022-07-08 DIAGNOSIS — D3131 Benign neoplasm of right choroid: Secondary | ICD-10-CM | POA: Diagnosis not present

## 2022-10-02 ENCOUNTER — Telehealth: Payer: Self-pay | Admitting: *Deleted

## 2022-10-02 MED ORDER — FLUCONAZOLE 150 MG PO TABS: 150.0000 mg | ORAL_TABLET | Freq: Once | ORAL | 0 refills | Status: AC

## 2022-10-02 NOTE — Telephone Encounter (Signed)
Patient informed. Rx sent 

## 2022-10-02 NOTE — Telephone Encounter (Signed)
Dr.Jertson patient)  Patient called c/o yeast infection, itching, vaginal irritation, using monistat and no relief. She asked if Rx could be sent to pharmacy to help with symptoms?  Please advise

## 2022-10-02 NOTE — Telephone Encounter (Signed)
Ok to send diflucan 150mg #1

## 2022-10-04 DIAGNOSIS — N7689 Other specified inflammation of vagina and vulva: Secondary | ICD-10-CM | POA: Diagnosis not present

## 2022-10-04 DIAGNOSIS — N3001 Acute cystitis with hematuria: Secondary | ICD-10-CM | POA: Diagnosis not present

## 2022-12-07 ENCOUNTER — Ambulatory Visit
Admission: RE | Admit: 2022-12-07 | Discharge: 2022-12-07 | Disposition: A | Discharge status: Home / Self Care | Payer: Medicare Other | Source: Ambulatory Visit | Attending: Obstetrics and Gynecology | Admitting: Obstetrics and Gynecology

## 2022-12-07 DIAGNOSIS — E2839 Other primary ovarian failure: Secondary | ICD-10-CM

## 2022-12-07 DIAGNOSIS — Z8739 Personal history of other diseases of the musculoskeletal system and connective tissue: Secondary | ICD-10-CM

## 2022-12-07 DIAGNOSIS — Z780 Asymptomatic menopausal state: Secondary | ICD-10-CM | POA: Diagnosis not present

## 2022-12-07 DIAGNOSIS — M8589 Other specified disorders of bone density and structure, multiple sites: Secondary | ICD-10-CM | POA: Diagnosis not present

## 2022-12-07 DIAGNOSIS — M810 Age-related osteoporosis without current pathological fracture: Secondary | ICD-10-CM | POA: Diagnosis not present

## 2023-02-03 ENCOUNTER — Other Ambulatory Visit: Payer: Self-pay | Admitting: Internal Medicine

## 2023-02-03 DIAGNOSIS — Z1231 Encounter for screening mammogram for malignant neoplasm of breast: Secondary | ICD-10-CM

## 2023-02-15 DIAGNOSIS — E559 Vitamin D deficiency, unspecified: Secondary | ICD-10-CM | POA: Diagnosis not present

## 2023-02-15 DIAGNOSIS — R030 Elevated blood-pressure reading, without diagnosis of hypertension: Secondary | ICD-10-CM | POA: Diagnosis not present

## 2023-02-15 DIAGNOSIS — I10 Essential (primary) hypertension: Secondary | ICD-10-CM | POA: Diagnosis not present

## 2023-02-15 DIAGNOSIS — E785 Hyperlipidemia, unspecified: Secondary | ICD-10-CM | POA: Diagnosis not present

## 2023-02-15 DIAGNOSIS — R7989 Other specified abnormal findings of blood chemistry: Secondary | ICD-10-CM | POA: Diagnosis not present

## 2023-02-22 DIAGNOSIS — I351 Nonrheumatic aortic (valve) insufficiency: Secondary | ICD-10-CM | POA: Diagnosis not present

## 2023-02-22 DIAGNOSIS — I341 Nonrheumatic mitral (valve) prolapse: Secondary | ICD-10-CM | POA: Diagnosis not present

## 2023-02-22 DIAGNOSIS — M1990 Unspecified osteoarthritis, unspecified site: Secondary | ICD-10-CM | POA: Diagnosis not present

## 2023-02-22 DIAGNOSIS — J302 Other seasonal allergic rhinitis: Secondary | ICD-10-CM | POA: Diagnosis not present

## 2023-02-22 DIAGNOSIS — I773 Arterial fibromuscular dysplasia: Secondary | ICD-10-CM | POA: Diagnosis not present

## 2023-02-22 DIAGNOSIS — M810 Age-related osteoporosis without current pathological fracture: Secondary | ICD-10-CM | POA: Diagnosis not present

## 2023-02-22 DIAGNOSIS — E785 Hyperlipidemia, unspecified: Secondary | ICD-10-CM | POA: Diagnosis not present

## 2023-02-22 DIAGNOSIS — Z1331 Encounter for screening for depression: Secondary | ICD-10-CM | POA: Diagnosis not present

## 2023-02-22 DIAGNOSIS — Z1339 Encounter for screening examination for other mental health and behavioral disorders: Secondary | ICD-10-CM | POA: Diagnosis not present

## 2023-02-22 DIAGNOSIS — N952 Postmenopausal atrophic vaginitis: Secondary | ICD-10-CM | POA: Diagnosis not present

## 2023-02-22 DIAGNOSIS — E559 Vitamin D deficiency, unspecified: Secondary | ICD-10-CM | POA: Diagnosis not present

## 2023-02-22 DIAGNOSIS — R82998 Other abnormal findings in urine: Secondary | ICD-10-CM | POA: Diagnosis not present

## 2023-02-22 DIAGNOSIS — Z0000 Encounter for general adult medical examination without abnormal findings: Secondary | ICD-10-CM | POA: Diagnosis not present

## 2023-02-22 DIAGNOSIS — G4700 Insomnia, unspecified: Secondary | ICD-10-CM | POA: Diagnosis not present

## 2023-02-22 DIAGNOSIS — I10 Essential (primary) hypertension: Secondary | ICD-10-CM | POA: Diagnosis not present

## 2023-02-26 DIAGNOSIS — H903 Sensorineural hearing loss, bilateral: Secondary | ICD-10-CM | POA: Diagnosis not present

## 2023-02-26 DIAGNOSIS — H9313 Tinnitus, bilateral: Secondary | ICD-10-CM | POA: Diagnosis not present

## 2023-03-03 ENCOUNTER — Ambulatory Visit
Admission: RE | Admit: 2023-03-03 | Discharge: 2023-03-03 | Disposition: A | Discharge status: Home / Self Care | Payer: Medicare Other | Source: Ambulatory Visit

## 2023-03-03 DIAGNOSIS — Z1231 Encounter for screening mammogram for malignant neoplasm of breast: Secondary | ICD-10-CM | POA: Diagnosis not present

## 2023-07-28 DIAGNOSIS — D3131 Benign neoplasm of right choroid: Secondary | ICD-10-CM | POA: Diagnosis not present

## 2024-02-09 ENCOUNTER — Other Ambulatory Visit: Payer: Self-pay | Admitting: Internal Medicine

## 2024-02-09 DIAGNOSIS — Z1231 Encounter for screening mammogram for malignant neoplasm of breast: Secondary | ICD-10-CM

## 2024-02-18 DIAGNOSIS — E785 Hyperlipidemia, unspecified: Secondary | ICD-10-CM | POA: Diagnosis not present

## 2024-02-18 DIAGNOSIS — E559 Vitamin D deficiency, unspecified: Secondary | ICD-10-CM | POA: Diagnosis not present

## 2024-02-18 DIAGNOSIS — Z1212 Encounter for screening for malignant neoplasm of rectum: Secondary | ICD-10-CM | POA: Diagnosis not present

## 2024-02-25 DIAGNOSIS — I10 Essential (primary) hypertension: Secondary | ICD-10-CM | POA: Diagnosis not present

## 2024-02-25 DIAGNOSIS — Z1339 Encounter for screening examination for other mental health and behavioral disorders: Secondary | ICD-10-CM | POA: Diagnosis not present

## 2024-02-25 DIAGNOSIS — R82998 Other abnormal findings in urine: Secondary | ICD-10-CM | POA: Diagnosis not present

## 2024-02-25 DIAGNOSIS — Z0000 Encounter for general adult medical examination without abnormal findings: Secondary | ICD-10-CM | POA: Diagnosis not present

## 2024-02-25 DIAGNOSIS — Z1331 Encounter for screening for depression: Secondary | ICD-10-CM | POA: Diagnosis not present

## 2024-03-01 ENCOUNTER — Encounter (HOSPITAL_COMMUNITY): Payer: Self-pay

## 2024-03-08 ENCOUNTER — Ambulatory Visit
Admission: RE | Admit: 2024-03-08 | Discharge: 2024-03-08 | Disposition: A | Discharge status: Home / Self Care | Source: Ambulatory Visit

## 2024-03-08 DIAGNOSIS — Z1231 Encounter for screening mammogram for malignant neoplasm of breast: Secondary | ICD-10-CM | POA: Diagnosis not present

## 2024-03-09 DIAGNOSIS — Z1211 Encounter for screening for malignant neoplasm of colon: Secondary | ICD-10-CM | POA: Diagnosis not present

## 2024-04-06 DIAGNOSIS — C44519 Basal cell carcinoma of skin of other part of trunk: Secondary | ICD-10-CM | POA: Diagnosis not present

## 2024-04-06 DIAGNOSIS — D225 Melanocytic nevi of trunk: Secondary | ICD-10-CM | POA: Diagnosis not present

## 2024-04-06 DIAGNOSIS — L821 Other seborrheic keratosis: Secondary | ICD-10-CM | POA: Diagnosis not present

## 2024-04-06 DIAGNOSIS — D485 Neoplasm of uncertain behavior of skin: Secondary | ICD-10-CM | POA: Diagnosis not present

## 2024-04-06 DIAGNOSIS — L578 Other skin changes due to chronic exposure to nonionizing radiation: Secondary | ICD-10-CM | POA: Diagnosis not present

## 2024-04-06 DIAGNOSIS — C44612 Basal cell carcinoma of skin of right upper limb, including shoulder: Secondary | ICD-10-CM | POA: Diagnosis not present

## 2024-04-06 DIAGNOSIS — L814 Other melanin hyperpigmentation: Secondary | ICD-10-CM | POA: Diagnosis not present

## 2024-04-06 DIAGNOSIS — L820 Inflamed seborrheic keratosis: Secondary | ICD-10-CM | POA: Diagnosis not present

## 2024-04-06 DIAGNOSIS — L538 Other specified erythematous conditions: Secondary | ICD-10-CM | POA: Diagnosis not present

## 2024-05-23 DIAGNOSIS — C44612 Basal cell carcinoma of skin of right upper limb, including shoulder: Secondary | ICD-10-CM | POA: Diagnosis not present

## 2024-05-24 DIAGNOSIS — L905 Scar conditions and fibrosis of skin: Secondary | ICD-10-CM | POA: Diagnosis not present

## 2024-05-24 DIAGNOSIS — D485 Neoplasm of uncertain behavior of skin: Secondary | ICD-10-CM | POA: Diagnosis not present

## 2024-05-25 DIAGNOSIS — L905 Scar conditions and fibrosis of skin: Secondary | ICD-10-CM | POA: Diagnosis not present

## 2024-05-25 DIAGNOSIS — C44519 Basal cell carcinoma of skin of other part of trunk: Secondary | ICD-10-CM | POA: Diagnosis not present

## 2024-08-02 ENCOUNTER — Ambulatory Visit: Admitting: Obstetrics and Gynecology

## 2024-08-02 ENCOUNTER — Other Ambulatory Visit (HOSPITAL_COMMUNITY)
Admission: RE | Admit: 2024-08-02 | Discharge: 2024-08-02 | Disposition: A | Discharge status: Home / Self Care | Source: Ambulatory Visit | Attending: Obstetrics and Gynecology | Admitting: Obstetrics and Gynecology

## 2024-08-02 ENCOUNTER — Encounter: Payer: Self-pay | Admitting: Obstetrics and Gynecology

## 2024-08-02 VITALS — BP 126/84 | HR 67 | Ht 66.0 in | Wt 153.0 lb

## 2024-08-02 DIAGNOSIS — M810 Age-related osteoporosis without current pathological fracture: Secondary | ICD-10-CM

## 2024-08-02 DIAGNOSIS — Z01419 Encounter for gynecological examination (general) (routine) without abnormal findings: Secondary | ICD-10-CM | POA: Diagnosis not present

## 2024-08-02 DIAGNOSIS — N952 Postmenopausal atrophic vaginitis: Secondary | ICD-10-CM | POA: Diagnosis not present

## 2024-08-02 DIAGNOSIS — Z124 Encounter for screening for malignant neoplasm of cervix: Secondary | ICD-10-CM

## 2024-08-02 NOTE — Patient Instructions (Addendum)
 Phone number for scheduling bone density at [place], [phone].    Estradiol Vaginal Cream What is this medication? ESTRADIOL (es tra DYE ole) reduces vaginal irritation, dryness, and pain during sex due to menopause. It is an estrogen hormone. This medicine may be used for other purposes; ask your health care provider or pharmacist if you have questions. COMMON BRAND NAME(S): Estrace What should I tell my care team before I take this medication? They need to know if you have any of these conditions: Abnormal vaginal bleeding Blood vessel disease or blood clots Breast, cervical, endometrial, ovarian, liver, or uterine cancer Dementia Diabetes Gallbladder disease Heart disease or recent heart attack High blood pressure High cholesterol High levels of calcium in the blood Hysterectomy Kidney disease Liver disease Migraine headaches Protein C/S deficiency Stroke Systemic lupus erythematosus (SLE) Tobacco use An unusual or allergic reaction to estrogens, soy, other medications, foods, dyes, or preservatives Pregnant or trying to get pregnant Breast-feeding How should I use this medication? This medication is for use in the vagina only. Do not take by mouth. Follow the directions on the prescription label. Read package directions carefully before using. Use the special applicator supplied with the cream. Wash hands before and after use. Fill the applicator with the prescribed amount of cream. Lie on your back, part and bend your knees. Insert the applicator into the vagina and push the plunger to expel the cream into the vagina. Wash the applicator with warm soapy water and rinse well. Use exactly as directed for the complete length of time prescribed. Do not stop using except on the advice of your care team. A patient package insert for the product will be given with each prescription and refill. Read this sheet carefully each time. The sheet may change frequently. Talk to your  care team about the use of this medication in children. This medication is not approved for use in children. Overdosage: If you think you have taken too much of this medicine contact a poison control center or emergency room at once. NOTE: This medicine is only for you. Do not share this medicine with others. What if I miss a dose? If you miss a dose, use it as soon as you can. If it is almost time for your next dose, use only that dose. Do not use double or extra doses. What may interact with this medication? Do not take this medication with any of the following: Aromatase inhibitors like aminoglutethimide, anastrozole, exemestane, letrozole, testolactone This medication may also interact with the following: Barbiturates used for inducing sleep or treating seizures Carbamazepine Grapefruit juice Medications for fungal infections like ketoconazole and itraconazole Raloxifene Rifabutin Rifampin Rifapentine Ritonavir Some antibiotics used to treat infections St. John's Wort Tamoxifen Warfarin This list may not describe all possible interactions. Give your health care provider a list of all the medicines, herbs, non-prescription drugs, or dietary supplements you use. Also tell them if you smoke, drink alcohol , or use illegal drugs. Some items may interact with your medicine. What should I watch for while using this medication? Visit your care team for regular checks on your progress. You will need a regular breast and pelvic exam. You should also discuss the need for regular mammograms with your care team, and follow their guidelines. This medication can make your body retain fluid, making your fingers, hands, or ankles swell. Your blood pressure can go up. Contact your care team if you feel you are retaining fluid. If you have any reason to think you are  pregnant, stop taking this medication at once and contact your care team. Smoking tobacco increases the risk of getting a blood clot or  having a stroke while you are taking this medication, especially if you are older than 35 years. If you wear contact lenses and notice visual changes, or if the lenses begin to feel uncomfortable, consult your eye care specialist. If you are going to have elective surgery, you may need to stop taking this medication beforehand. Consult your care team for advice prior to scheduling the surgery. What side effects may I notice from receiving this medication? Side effects that you should report to your care team as soon as possible: Allergic reactions--skin rash, itching, hives, swelling of the face, lips, tongue, or throat Blood clot--pain, swelling, or warmth in the leg, shortness of breath, chest pain Breast tissue changes, new lumps, redness, pain, or discharge from the nipple Gallbladder problems--severe stomach pain, nausea, vomiting, fever Increase in blood pressure Liver injury--right upper belly pain, loss of appetite, nausea, light-colored stool, dark yellow or brown urine, yellowing skin or eyes, unusual weakness or fatigue Stroke--sudden numbness or weakness of the face, arm, or leg, trouble speaking, confusion, trouble walking, loss of balance or coordination, dizziness, severe headache, change in vision Unusual vaginal discharge, itching, or odor Vaginal bleeding after menopause, pelvic pain Side effects that usually do not require medical attention (report to your care team if they continue or are bothersome): Bloating Breast pain or tenderness Nausea Vaginal irritation at application site Vomiting This list may not describe all possible side effects. Call your doctor for medical advice about side effects. You may report side effects to FDA at 1-800-FDA-1088. Where should I keep my medication? Keep out of the reach of children and pets. Store at room temperature between 15 and 30 degrees C (59 and 86 degrees F). Protect from temperatures above 40 degrees C (104 degrees C). Do not  freeze. Throw away any unused medication after the expiration date. NOTE: This sheet is a summary. It may not cover all possible information. If you have questions about this medicine, talk to your doctor, pharmacist, or health care provider.  2024 Elsevier/Gold Standard (2021-06-20 00:00:00)  Vaginal Dryness and Thinning (Atrophic Vaginitis): What to Know  Atrophic vaginitis is when the lining of the vagina becomes dry, thin, and inflamed. Tell your doctor if you notice any changes in your vagina. They can help you find ways to feel better. They can also treat infections and suggest healthy habits for a healthy vagina. What are the causes? This condition is caused by a drop in estrogen. This affects the moisture in the lining of your vagina. When estrogen levels are low, the tissues become dry. This is most common when menstrual periods stop during menopause. What increases the risk? You're more likely to get this condition if: You take medicines that block estrogen. You've had your ovaries taken out. You're being treated for cancer. You recently had a baby. You're breastfeeding. You're over 70 years old. You have an eating disorder. You smoke. What are the signs or symptoms? Pain during sex. Soreness during sex. Bleeding during sex. Burning or itching around your vagina. Loss of interest in sex. Burning pain when you pee. Peeing often. Fluid coming from your vagina that isn't normal. It may be: White. [place]. Yellow. Tinted with blood. Thick. Watery. Some people don't have symptoms. How is this treated? Using a lubricant before sex. Using a moisturizer in the vagina. Using estrogen in the vagina. You may  not need treatment if your symptoms are mild or you're not having sex. Follow these instructions at home: Medicines Take your medicines only as told. Do not use herbal or other medicines unless your doctor says it's safe. Use creams, lubricants, or moisturizers only as  told. General instructions Talk with your doctor about any menopause symptoms and treatment options. Do not douche. Do not use scented: Sprays. Tampons. Soaps. If sex hurts, try using lubricants right before you have sex. Contact a doctor if: You have fluid coming from the vagina that's not normal. You have a smell coming from your vagina. You have new symptoms. Your symptoms don't get better with treatment. Your symptoms get worse. This information is not intended to replace advice given to you by your health care provider. Make sure you discuss any questions you have with your health care provider. Document Revised: 05/24/2023 Document Reviewed: 05/24/2023 Elsevier Patient Education  2024 Elsevier Inc.  EXERCISE AND DIET:  We recommended that you start or continue a regular exercise program for good health. Regular exercise means any activity that makes your heart beat faster and makes you sweat.  We recommend exercising at least 30 minutes per day at least 3 days a week, preferably 4 or 5.  We also recommend a diet low in fat and sugar.  Inactivity, poor dietary choices and obesity can cause diabetes, heart attack, stroke, and kidney damage, among others.    ALCOHOL  AND SMOKING:  Women should limit their alcohol  intake to no more than 7 drinks/beers/glasses of wine (combined, not each!) per week. Moderation of alcohol  intake to this level decreases your risk of breast cancer and liver damage. And of course, no recreational drugs are part of a healthy lifestyle.  And absolutely no smoking or even second hand smoke. Most people know smoking can cause heart and lung diseases, but did you know it also contributes to weakening of your bones? Aging of your skin?  Yellowing of your teeth and nails?  CALCIUM AND VITAMIN D:  Adequate intake of calcium and Vitamin D are recommended.  The recommendations for exact amounts of these supplements seem to change often, but generally speaking 600 mg of  calcium (either carbonate or citrate) and 800 units of Vitamin D per day seems prudent. Certain women may benefit from higher intake of Vitamin D.  If you are among these women, your doctor will have told you during your visit.    PAP SMEARS:  Pap smears, to check for cervical cancer or precancers,  have traditionally been done yearly, although recent scientific advances have shown that most women can have pap smears less often.  However, every woman still should have a physical exam from her gynecologist every year. It will include a breast check, inspection of the vulva and vagina to check for abnormal growths or skin changes, a visual exam of the cervix, and then an exam to evaluate the size and shape of the uterus and ovaries.  And after 69 years of age, a rectal exam is indicated to check for rectal cancers. We will also provide age appropriate advice regarding health maintenance, like when you should have certain vaccines, screening for sexually transmitted diseases, bone density testing, colonoscopy, mammograms, etc.   MAMMOGRAMS:  All women over 94 years old should have a yearly mammogram. Many facilities now offer a 3D mammogram, which may cost around $50 extra out of pocket. If possible,  we recommend you accept the option to have the 3D mammogram performed.  It both reduces the number of women who will be called back for extra views which then turn out to be normal, and it is better than the routine mammogram at detecting truly abnormal areas.    COLONOSCOPY:  Colonoscopy to screen for colon cancer is recommended for all women at age 38.  We know, you hate the idea of the prep.  We agree, BUT, having colon cancer and not knowing it is worse!!  Colon cancer so often starts as a polyp that can be seen and removed at colonscopy, which can quite literally save your life!  And if your first colonoscopy is normal and you have no family history of colon cancer, most women don't have to have it again for  10 years.  Once every ten years, you can do something that may end up saving your life, right?  We will be happy to help you get it scheduled when you are ready.  Be sure to check your insurance coverage so you understand how much it will cost.  It may be covered as a preventative service at no cost, but you should check your particular policy.

## 2024-08-02 NOTE — Progress Notes (Signed)
 68 y.o. G75P3003 Married Caucasian female here for a breast and pelvic exam.    The patient is also followed for osteoporosis. Last BMD in 2024, at [facility] ordered by Dr. Jertson. Hx Fosamax use.  PCP prescribed.   Has vaginal dryness.   Has dilators but not used them.   Not sexually active due to her husband's issues.    Dealing with allergies.  3 daughters.  9th grandchild just born.    PCP: Onita Rush, MD   Patient's last menstrual period was 07/26/2005 (approximate).           Sexually active: No.  The current method of family planning is post menopausal status.    Menopausal hormone therapy:  n/a Exercising: Yes.    Walking 3-5 miles  Smoker:  no  OB History     Gravida  3   Para  3   Term  3   Preterm  0   AB  0   Living  3      SAB  0   IAB  0   Ectopic  0   Multiple  0   Live Births  3           HEALTH MAINTENANCE: Last 2 paps: 06/18/22 neg, 05/02/19 neg History of abnormal Pap or positive HPV:  no Mammogram:  03/08/24 Breast Density Cat B, BIRADS Cat 1 neg  Colonoscopy:  Cologuard 2025 per pt  Bone Density:  12/07/22  Result  osteoporosis - spine and hip.  PCP also following.    Immunization History  Administered Date(s) Administered   Tetanus 10/26/2014      reports that she has never smoked. She has never used smokeless tobacco. She reports current alcohol  use of about 1.0 - 2.0 standard drink of alcohol  per week. She reports that she does not use drugs.  Past Medical History:  Diagnosis Date   Cerebral aneurysm    Fibromuscular dysplasia of cervicocranial artery   Elevated cholesterol    History of aneurysm    History of hypertension    MVP (mitral valve prolapse)    per patient physician states she does not have this   Osteoporosis 12/2014    Past Surgical History:  Procedure Laterality Date   APPENDECTOMY  1976   CESAREAN SECTION      Current Outpatient Medications  Medication Sig Dispense Refill    amLODipine (NORVASC) 10 MG tablet Take 10 mg by mouth daily.     aspirin EC 81 MG tablet Take by mouth.     rosuvastatin (CRESTOR) 5 MG tablet Take 5 mg by mouth every other day.      No current facility-administered medications for this visit.    ALLERGIES: Other  Family History  Problem Relation Age of Onset   Hyperlipidemia Mother    Heart failure Mother    Breast cancer Mother 62       BRCA -, mastectomy right breast   Alcohol  abuse Father    Hypertension Sister    Hyperlipidemia Sister    Hyperlipidemia Brother    Hypertension Brother    Hyperlipidemia Maternal Uncle    Hypertension Maternal Uncle    Heart disease Maternal Uncle    Stroke Paternal Grandmother    Stroke Paternal Grandfather    Hypertension Brother    Hyperlipidemia Brother     Review of Systems  All other systems reviewed and are negative.   PHYSICAL EXAM:  BP 126/84 (BP Location: Left Arm, Patient Position:  Sitting)   Pulse 67   Ht 5' 6 (1.676 m)   Wt 153 lb (69.4 kg)   LMP 07/26/2005 (Approximate)   SpO2 98%   BMI 24.69 kg/m     General appearance: alert, cooperative and appears stated age Head: normocephalic, without obvious abnormality, atraumatic Neck: no adenopathy, supple, symmetrical, trachea midline and thyroid  normal to inspection and palpation Lungs: clear to auscultation bilaterally Breasts: normal appearance, no masses or tenderness, No nipple retraction or dimpling, No nipple discharge or bleeding, No axillary adenopathy Heart: regular rate and rhythm Abdomen: soft, non-tender; no masses, no organomegaly Extremities: extremities normal, atraumatic, no cyanosis or edema Skin: skin color, texture, turgor normal. No rashes or lesions Lymph nodes: cervical, supraclavicular, and axillary nodes normal. Neurologic: grossly normal  Pelvic: External genitalia:  no lesions              No abnormal inguinal nodes palpated.              Urethra:  normal appearing urethra with no masses,  tenderness or lesions              Bartholins and Skenes: normal                 Vagina: normal appearing vagina with normal color and discharge, no lesions              Cervix: no lesions              Pap taken: yes Bimanual Exam:  Uterus:  normal size, contour, position, consistency, mobility, non-tender              Adnexa: no mass, fullness, tenderness              Rectal exam: yes.  Confirms.              Anus:  normal sphincter tone, no lesions  Chaperone was present for exam:  Clotilda Pa, CMA.  ASSESSMENT: Encounter for breast and pelvic exam.  Cervical cancer screening.  Osteoporosis.  Off Fosamax.    Vaginal atrophy.  FH breast cancer in mother.  Cerebral aneurysm.  Fibromuscular dysplasia of cervicocranial artery  Increased risk for stroke.   PLAN: Mammogram screening discussed. Self breast awareness reviewed. Pap and reflex HRV collected:  yes Guidelines for Calcium, Vitamin D, regular exercise program including cardiovascular and weight bearing exercise. Medication refills:  NA. Vaginal estrogen discussed as tx option for atrophy.  I discussed potential effect on pre-existing breast cancer.   She will consider this option. Labs with PCP.  BMD at [facility].  Follow up:  2 years.  She will return in 1 year if she starts vaginal estrogen cream.    Additional counseling given.  yes. 30 min  total time was spent for this patient encounter, including preparation, face-to-face counseling with the patient, coordination of care, and documentation of the encounter in addition to doing the breast and pelvic exam.

## 2024-08-04 LAB — CYTOLOGY - PAP: Diagnosis: NEGATIVE

## 2024-08-06 ENCOUNTER — Ambulatory Visit: Payer: Self-pay | Admitting: Obstetrics and Gynecology

## 2024-08-07 DIAGNOSIS — D3131 Benign neoplasm of right choroid: Secondary | ICD-10-CM | POA: Diagnosis not present

## 2024-08-10 DIAGNOSIS — H903 Sensorineural hearing loss, bilateral: Secondary | ICD-10-CM | POA: Diagnosis not present
